# Patient Record
Sex: Male | Born: 1945 | Race: White | Hispanic: No | Marital: Married | Smoking: Never smoker
Health system: Southern US, Community
[De-identification: ages and names within clinical notes are randomized; demographics above are authoritative.]

## PROBLEM LIST (undated history)

## (undated) DIAGNOSIS — I429 Cardiomyopathy, unspecified: Secondary | ICD-10-CM

## (undated) DIAGNOSIS — I10 Essential (primary) hypertension: Secondary | ICD-10-CM

## (undated) DIAGNOSIS — I7300 Raynaud's syndrome without gangrene: Secondary | ICD-10-CM

## (undated) DIAGNOSIS — E785 Hyperlipidemia, unspecified: Secondary | ICD-10-CM

## (undated) DIAGNOSIS — E559 Vitamin D deficiency, unspecified: Secondary | ICD-10-CM

## (undated) DIAGNOSIS — I351 Nonrheumatic aortic (valve) insufficiency: Secondary | ICD-10-CM

## (undated) DIAGNOSIS — M3320 Polymyositis, organ involvement unspecified: Secondary | ICD-10-CM

## (undated) HISTORY — PX: COLONOSCOPY: SHX174

## (undated) HISTORY — PX: MUSCLE BIOPSY: SHX716

---

## 2004-09-18 ENCOUNTER — Ambulatory Visit: Payer: Self-pay | Admitting: Unknown Physician Specialty

## 2004-12-28 ENCOUNTER — Ambulatory Visit: Payer: Self-pay | Admitting: Otolaryngology

## 2005-03-19 ENCOUNTER — Ambulatory Visit: Payer: Self-pay | Admitting: Otolaryngology

## 2011-04-11 ENCOUNTER — Ambulatory Visit: Payer: Self-pay | Admitting: Unknown Physician Specialty

## 2011-11-19 ENCOUNTER — Ambulatory Visit: Payer: Self-pay | Admitting: Family Medicine

## 2011-11-20 ENCOUNTER — Ambulatory Visit: Payer: Self-pay | Admitting: Family Medicine

## 2011-11-20 LAB — CREATININE, SERUM: EGFR (Non-African Amer.): >60

## 2011-12-31 DIAGNOSIS — R5383 Other fatigue: Secondary | ICD-10-CM | POA: Insufficient documentation

## 2011-12-31 DIAGNOSIS — E785 Hyperlipidemia, unspecified: Secondary | ICD-10-CM | POA: Insufficient documentation

## 2011-12-31 DIAGNOSIS — M79609 Pain in unspecified limb: Secondary | ICD-10-CM | POA: Insufficient documentation

## 2011-12-31 DIAGNOSIS — Z0000 Encounter for general adult medical examination without abnormal findings: Secondary | ICD-10-CM | POA: Insufficient documentation

## 2012-09-28 DIAGNOSIS — E559 Vitamin D deficiency, unspecified: Secondary | ICD-10-CM | POA: Insufficient documentation

## 2012-09-28 DIAGNOSIS — N529 Male erectile dysfunction, unspecified: Secondary | ICD-10-CM | POA: Insufficient documentation

## 2012-09-28 DIAGNOSIS — J309 Allergic rhinitis, unspecified: Secondary | ICD-10-CM | POA: Insufficient documentation

## 2012-10-24 DIAGNOSIS — K219 Gastro-esophageal reflux disease without esophagitis: Secondary | ICD-10-CM | POA: Insufficient documentation

## 2013-08-10 DIAGNOSIS — M1990 Unspecified osteoarthritis, unspecified site: Secondary | ICD-10-CM | POA: Insufficient documentation

## 2015-06-23 DIAGNOSIS — I272 Pulmonary hypertension, unspecified: Secondary | ICD-10-CM | POA: Insufficient documentation

## 2015-12-11 ENCOUNTER — Other Ambulatory Visit: Payer: Self-pay | Admitting: Otolaryngology

## 2015-12-11 DIAGNOSIS — H905 Unspecified sensorineural hearing loss: Secondary | ICD-10-CM

## 2016-01-02 ENCOUNTER — Ambulatory Visit: Payer: Self-pay

## 2016-05-17 ENCOUNTER — Other Ambulatory Visit: Payer: Self-pay | Admitting: Nurse Practitioner

## 2016-05-17 DIAGNOSIS — R1084 Generalized abdominal pain: Secondary | ICD-10-CM

## 2016-05-21 ENCOUNTER — Ambulatory Visit
Admission: RE | Admit: 2016-05-21 | Discharge: 2016-05-21 | Disposition: A | Discharge status: Home / Self Care | Payer: Medicare Other | Source: Ambulatory Visit | Attending: Nurse Practitioner | Admitting: Nurse Practitioner

## 2016-05-21 DIAGNOSIS — K5730 Diverticulosis of large intestine without perforation or abscess without bleeding: Secondary | ICD-10-CM | POA: Diagnosis not present

## 2016-05-21 DIAGNOSIS — N400 Enlarged prostate without lower urinary tract symptoms: Secondary | ICD-10-CM | POA: Diagnosis not present

## 2016-05-21 DIAGNOSIS — I861 Scrotal varices: Secondary | ICD-10-CM | POA: Diagnosis not present

## 2016-05-21 DIAGNOSIS — R1084 Generalized abdominal pain: Secondary | ICD-10-CM | POA: Insufficient documentation

## 2016-05-21 DIAGNOSIS — I700 Atherosclerosis of aorta: Secondary | ICD-10-CM | POA: Diagnosis not present

## 2016-05-21 DIAGNOSIS — N281 Cyst of kidney, acquired: Secondary | ICD-10-CM | POA: Insufficient documentation

## 2016-05-21 MED ORDER — IOPAMIDOL (ISOVUE-300) INJECTION 61%
100.0000 mL | Freq: Once | INTRAVENOUS | Status: AC | PRN
  Administered 2016-05-21: 100 mL via INTRAVENOUS

## 2016-07-18 ENCOUNTER — Encounter: Payer: Self-pay | Admitting: *Deleted

## 2016-07-19 ENCOUNTER — Ambulatory Visit: Payer: Medicare Other | Admitting: Anesthesiology

## 2016-07-19 ENCOUNTER — Encounter: Payer: Self-pay | Admitting: *Deleted

## 2016-07-19 ENCOUNTER — Ambulatory Visit
Admission: RE | Admit: 2016-07-19 | Discharge: 2016-07-19 | Disposition: A | Discharge status: Home / Self Care | Payer: Medicare Other | Source: Ambulatory Visit | Attending: Unknown Physician Specialty | Admitting: Unknown Physician Specialty

## 2016-07-19 ENCOUNTER — Encounter
Admission: RE | Disposition: A | Discharge status: Home / Self Care | Payer: Self-pay | Source: Ambulatory Visit | Attending: Unknown Physician Specialty

## 2016-07-19 DIAGNOSIS — R1013 Epigastric pain: Secondary | ICD-10-CM | POA: Diagnosis not present

## 2016-07-19 DIAGNOSIS — Z8371 Family history of colonic polyps: Secondary | ICD-10-CM | POA: Diagnosis not present

## 2016-07-19 DIAGNOSIS — I429 Cardiomyopathy, unspecified: Secondary | ICD-10-CM | POA: Diagnosis not present

## 2016-07-19 DIAGNOSIS — K648 Other hemorrhoids: Secondary | ICD-10-CM | POA: Diagnosis not present

## 2016-07-19 DIAGNOSIS — K2980 Duodenitis without bleeding: Secondary | ICD-10-CM | POA: Diagnosis not present

## 2016-07-19 DIAGNOSIS — K2950 Unspecified chronic gastritis without bleeding: Secondary | ICD-10-CM | POA: Insufficient documentation

## 2016-07-19 DIAGNOSIS — K269 Duodenal ulcer, unspecified as acute or chronic, without hemorrhage or perforation: Secondary | ICD-10-CM | POA: Diagnosis not present

## 2016-07-19 DIAGNOSIS — K644 Residual hemorrhoidal skin tags: Secondary | ICD-10-CM | POA: Diagnosis not present

## 2016-07-19 DIAGNOSIS — Z7982 Long term (current) use of aspirin: Secondary | ICD-10-CM | POA: Diagnosis not present

## 2016-07-19 DIAGNOSIS — E785 Hyperlipidemia, unspecified: Secondary | ICD-10-CM | POA: Diagnosis not present

## 2016-07-19 DIAGNOSIS — K449 Diaphragmatic hernia without obstruction or gangrene: Secondary | ICD-10-CM | POA: Diagnosis not present

## 2016-07-19 DIAGNOSIS — Z1211 Encounter for screening for malignant neoplasm of colon: Secondary | ICD-10-CM | POA: Insufficient documentation

## 2016-07-19 HISTORY — PX: COLONOSCOPY WITH PROPOFOL: SHX5780

## 2016-07-19 HISTORY — DX: Hyperlipidemia, unspecified: E78.5

## 2016-07-19 HISTORY — PX: ESOPHAGOGASTRODUODENOSCOPY (EGD) WITH PROPOFOL: SHX5813

## 2016-07-19 HISTORY — DX: Nonrheumatic aortic (valve) insufficiency: I35.1

## 2016-07-19 HISTORY — DX: Polymyositis, organ involvement unspecified: M33.20

## 2016-07-19 HISTORY — DX: Raynaud's syndrome without gangrene: I73.00

## 2016-07-19 HISTORY — DX: Cardiomyopathy, unspecified: I42.9

## 2016-07-19 HISTORY — DX: Essential (primary) hypertension: I10

## 2016-07-19 HISTORY — DX: Vitamin D deficiency, unspecified: E55.9

## 2016-07-19 SURGERY — COLONOSCOPY WITH PROPOFOL
Anesthesia: General

## 2016-07-19 MED ORDER — PROPOFOL 500 MG/50ML IV EMUL
INTRAVENOUS | Status: DC | PRN
  Administered 2016-07-19: 125 ug/kg/min via INTRAVENOUS

## 2016-07-19 MED ORDER — DEXMEDETOMIDINE HCL 200 MCG/2ML IV SOLN
INTRAVENOUS | Status: DC | PRN
  Administered 2016-07-19: 8 ug via INTRAVENOUS

## 2016-07-19 MED ORDER — SODIUM CHLORIDE 0.9 % IV SOLN: INTRAVENOUS | Status: DC

## 2016-07-19 MED ORDER — SODIUM CHLORIDE 0.9 % IV SOLN
INTRAVENOUS | Status: DC
  Administered 2016-07-19: 11:00:00 via INTRAVENOUS

## 2016-07-19 MED ORDER — LACTATED RINGERS IV SOLN
INTRAVENOUS | Status: DC | PRN
  Administered 2016-07-19: 11:00:00 via INTRAVENOUS

## 2016-07-19 MED ORDER — GLYCOPYRROLATE 0.2 MG/ML IJ SOLN
INTRAMUSCULAR | Status: DC | PRN
  Administered 2016-07-19: 0.2 mg via INTRAVENOUS

## 2016-07-19 MED ORDER — PROPOFOL 10 MG/ML IV BOLUS
INTRAVENOUS | Status: DC | PRN
  Administered 2016-07-19: 20 mg via INTRAVENOUS
  Administered 2016-07-19 (×2): 50 mg via INTRAVENOUS

## 2016-07-19 NOTE — H&P (Signed)
   Primary Care Physician:  Sula RumpleVirk, Charanjit, MD Primary Gastroenterologist:  Dr. Mechele CollinElliott  Pre-Procedure History & Physical: HPI:  Kent Cunningham Donovan is a 70 y.o. male is here for an endoscopy and colonoscopy.   Past Medical History:  Diagnosis Date  . Aortic insufficiency   . Cardiomyopathy (HCC)   . Dyslipidemia   . Hypertension   . Polymyositis (HCC)   . Raynaud's disease   . Vitamin D deficiency     Past Surgical History:  Procedure Laterality Date  . COLONOSCOPY    . MUSCLE BIOPSY      Prior to Admission medications   Medication Sig Start Date End Date Taking? Authorizing Provider  aspirin 81 MG chewable tablet Chew by mouth daily.   Yes Historical Provider, MD  b complex vitamins tablet Take 1 tablet by mouth daily.   Yes Historical Provider, MD  Bioflavonoid Products (BIOFLEX PO) Take by mouth.   Yes Historical Provider, MD  Thressa Shellerinnamon Bark POWD by Does not apply route.   Yes Historical Provider, MD  CRANBERRY EXTRACT PO Take by mouth.   Yes Historical Provider, MD  NATURAL PSYLLIUM SEED PO Take by mouth.   Yes Historical Provider, MD  niacin 500 MG tablet Take 500 mg by mouth at bedtime.   Yes Historical Provider, MD  OMEGA-3 FATTY ACIDS PO Take by mouth.   Yes Historical Provider, MD  TURMERIC PO Take by mouth.   Yes Historical Provider, MD  vitamin C (ASCORBIC ACID) 500 MG tablet Take 500 mg by mouth daily.   Yes Historical Provider, MD    Allergies as of 07/09/2016  . (Not on File)    History reviewed. No pertinent family history.  Social History   Social History  . Marital status: Married    Spouse name: N/A  . Number of children: N/A  . Years of education: N/A   Occupational History  . Not on file.   Social History Main Topics  . Smoking status: Never Smoker  . Smokeless tobacco: Never Used  . Alcohol use Yes  . Drug use: No  . Sexual activity: Not on file   Other Topics Concern  . Not on file   Social History Narrative  . No narrative on file     Review of Systems: See HPI, otherwise negative ROS  Physical Exam: BP (!) 149/97   Pulse 76   Temp 97.1 F (36.2 C) (Tympanic)   Resp (!) 22   Ht 6\' 1"  (1.854 m)   Wt 79.4 kg (175 lb)   SpO2 100%   BMI 23.09 kg/m  General:   Alert,  pleasant and cooperative in NAD Head:  Normocephalic and atraumatic. Neck:  Supple; no masses or thyromegaly. Lungs:  Clear throughout to auscultation.    Heart:  Regular rate and rhythm. Abdomen:  Soft, nontender and nondistended. Normal bowel sounds, without guarding, and without rebound.   Neurologic:  Alert and  oriented x4;  grossly normal neurologically.  Impression/Plan: Kent Cunningham Forster is here for an endoscopy and colon[facility],  And epigastric abdominal pain and general abdominal pain.  Risks, benefits, limitations, and alternatives regarding  endoscopy and colonoscopy have been reviewed with the patient.  Questions have been answered.  All parties agreeable.   Lynnae PrudeELLIOTT, Idriss Quackenbush, MD  07/19/2016, 10:45 AM

## 2016-07-19 NOTE — Anesthesia Preprocedure Evaluation (Signed)
Anesthesia Evaluation  Patient identified by MRN, date of birth, ID band Patient awake    Reviewed: Allergy & Precautions, H&P , NPO status , Patient's Chart, lab work & pertinent test results  History of Anesthesia Complications Negative for: history of anesthetic complications  Airway Mallampati: III  TM Distance: <3 FB Neck ROM: limited    Dental  (+) Poor Dentition, Chipped   Pulmonary neg pulmonary ROS, neg shortness of breath,    Pulmonary exam normal breath sounds clear to auscultation       Cardiovascular Exercise Tolerance: Good hypertension, (-) angina+ Peripheral Vascular Disease  (-) DOE Normal cardiovascular exam+ Valvular Problems/Murmurs  Rhythm:regular Rate:Normal     Neuro/Psych  Neuromuscular disease negative psych ROS   GI/Hepatic negative GI ROS, Neg liver ROS,   Endo/Other  negative endocrine ROS  Renal/GU negative Renal ROS  negative genitourinary   Musculoskeletal   Abdominal   Peds  Hematology negative hematology ROS (+)   Anesthesia Other Findings Past Medical History: No date: Aortic insufficiency No date: Cardiomyopathy (HCC) No date: Dyslipidemia No date: Hypertension No date: Polymyositis (HCC) No date: Raynaud's disease No date: Vitamin D deficiency  Past Surgical History: No date: COLONOSCOPY No date: MUSCLE BIOPSY     Reproductive/Obstetrics negative OB ROS                             Anesthesia Physical Anesthesia Plan  ASA: III  Anesthesia Plan: General   Post-op Pain Management:    Induction:   Airway Management Planned:   Additional Equipment:   Intra-op Plan:   Post-operative Plan:   Informed Consent: I have reviewed the patients History and Physical, chart, labs and discussed the procedure including the risks, benefits and alternatives for the proposed anesthesia with the patient or authorized representative who has indicated  his/her understanding and acceptance.   Dental Advisory Given  Plan Discussed with: Anesthesiologist, CRNA and Surgeon  Anesthesia Plan Comments:         Anesthesia Quick Evaluation

## 2016-07-19 NOTE — Op Note (Signed)
[facility] Patient Name: Kent GongMichael Lamar Procedure Date: 07/19/2016 10:35 AM MRN: [id number] Account #: [id number] Date of Birth: 1946/09/26 Admit Type: Outpatient Age: [age] [facility] Gender: Male Note Status: Finalized Procedure:            Upper GI endoscopy Indications:          Epigastric abdominal pain Providers:            Scot Junobert T. Eshawn Coor, MD Referring MD:         Haynes Kernsharanjit S. Virk (Referring MD) Medicines:            Propofol per Anesthesia Complications:        No immediate complications. Procedure:            Pre-Anesthesia Assessment:                       - After reviewing the risks and benefits, the patient                        was deemed in satisfactory condition to undergo the                        procedure.                       After obtaining informed consent, the endoscope was                        passed under direct vision. Throughout the procedure,                        the patient's blood pressure, pulse, and oxygen                        saturations were monitored continuously. The Endoscope                        was introduced through the mouth, and advanced to the                        second part of duodenum. The upper GI endoscopy was                        accomplished without difficulty. The patient tolerated                        the procedure well. Findings:      The examined esophagus was normal. GEJ 44cm.      A medium-sized hiatal hernia was present.      Diffuse and patchy moderate inflammation characterized by erythema,       granularity and shallow ulcerations was found in the gastric antrum.       Biopsies were taken with a cold forceps for histology. Biopsies were       taken with a cold forceps for Helicobacter pylori testing.      One non-bleeding superficial duodenal ulcer with no stigmata of bleeding       was found in the second portion of the duodenum. The lesion was 4 mm in    largest dimension.      Diffuse moderate inflammation characterized by erythema and granularity  was found in the duodenal bulb. Impression:           - Normal esophagus.                       - Medium-sized hiatal hernia.                       - Gastritis. Biopsied.                       - One non-bleeding duodenal ulcer with no stigmata of                        bleeding.                       - Duodenitis. Recommendation:       - Await pathology results. Carafate and acid reduction                        medicine. Scot Jun, MD 07/19/2016 11:05:02 AM This report has been signed electronically. Number of Addenda: 0 Note Initiated On: 07/19/2016 10:35 AM      [facility]

## 2016-07-19 NOTE — Transfer of Care (Signed)
Immediate Anesthesia Transfer of Care Note  Patient: Kent BrownMichael E Cunningham  Procedure(s) Performed: Procedure(s): COLONOSCOPY WITH PROPOFOL (N/A) ESOPHAGOGASTRODUODENOSCOPY (EGD) WITH PROPOFOL (N/A)  Patient Location: PACU and Endoscopy Unit  Anesthesia Type:General  Level of Consciousness: awake, alert  and oriented  Airway & Oxygen Therapy: Patient Spontanous Breathing and Patient connected to nasal cannula oxygen  Post-op Assessment: Report given to RN and Post -op Vital signs reviewed and stable  Post vital signs: Reviewed and stable  Last Vitals:  Vitals:   07/19/16 1037 07/19/16 1123  BP: (!) 149/97 131/76  Pulse: 76 77  Resp: (!) 22 (!) 22  Temp: 36.2 C (!) 35.8 C    Last Pain:  Vitals:   07/19/16 1123  TempSrc: Tympanic         Complications: No apparent anesthesia complications

## 2016-07-19 NOTE — Op Note (Signed)
[facility] Gastroenterology Patient Name: Kent Cunningham Procedure Date: 07/19/2016 10:34 AM MRN: [id number] Account #: [id number] Date of Birth: 1946/09/20 Admit Type: Outpatient Age: 70 Room: [facility] ENDO ROOM 1 Gender: Male Note Status: Finalized Procedure:            Colonoscopy Indications:          Colon cancer screening in patient at increased risk:                        Family history of 1st-degree relative with colon polyps Providers:            Scot Jun, MD Referring MD:         Haynes Kerns (Referring MD) Medicines:            Propofol per Anesthesia Complications:        No immediate complications. Procedure:            Pre-Anesthesia Assessment:                       - After reviewing the risks and benefits, the patient                        was deemed in satisfactory condition to undergo the                        procedure.                       After obtaining informed consent, the colonoscope was                        passed under direct vision. Throughout the procedure,                        the patient's blood pressure, pulse, and oxygen                        saturations were monitored continuously. The                        Colonoscope was introduced through the anus and                        advanced to the the cecum, identified by appendiceal                        orifice and ileocecal valve. The colonoscopy was                        performed without difficulty. The patient tolerated the                        procedure well. The quality of the bowel preparation                        was excellent. Findings:      External and internal hemorrhoids were found during endoscopy. The       hemorrhoids were medium-sized and Grade II (internal hemorrhoids that       prolapse but reduce spontaneously).      The exam was otherwise  without abnormality. Impression:           - External and internal hemorrhoids.      - The examination was otherwise normal.                       - No specimens collected. Recommendation:       - Repeat colonoscopy in 5 years for surveillance due to                        family history. Scot Junobert T Elliott, MD 07/19/2016 11:23:43 AM This report has been signed electronically. Number of Addenda: 0 Note Initiated On: 07/19/2016 10:34 AM Scope Withdrawal Time: 0 hours 11 minutes 6 seconds  Total Procedure Duration: 0 hours 14 minutes 51 seconds       [facility]

## 2016-07-20 ENCOUNTER — Encounter: Payer: Self-pay | Admitting: Unknown Physician Specialty

## 2016-07-20 NOTE — Anesthesia Postprocedure Evaluation (Signed)
Anesthesia Post Note  Patient: America BrownMichael E Riggan  Procedure(s) Performed: Procedure(s) (LRB): COLONOSCOPY WITH PROPOFOL (N/A) ESOPHAGOGASTRODUODENOSCOPY (EGD) WITH PROPOFOL (N/A)  Patient location during evaluation: Endoscopy Anesthesia Type: General Level of consciousness: awake and alert Pain management: pain level controlled Vital Signs Assessment: post-procedure vital signs reviewed and stable Respiratory status: spontaneous breathing, nonlabored ventilation, respiratory function stable and patient connected to nasal cannula oxygen Cardiovascular status: blood pressure returned to baseline and stable Postop Assessment: no signs of nausea or vomiting Anesthetic complications: no    Last Vitals:  Vitals:   07/19/16 1143 07/19/16 1153  BP: 130/85 (!) 148/96  Pulse: 89 81  Resp: 14 12  Temp:      Last Pain:  Vitals:   07/19/16 1123  TempSrc: Tympanic                 Cleda MccreedyJoseph K Piscitello

## 2016-07-22 LAB — SURGICAL PATHOLOGY

## 2018-03-03 ENCOUNTER — Other Ambulatory Visit: Payer: Self-pay | Admitting: Family Medicine

## 2018-03-03 DIAGNOSIS — N50812 Left testicular pain: Secondary | ICD-10-CM

## 2018-03-10 ENCOUNTER — Ambulatory Visit
Admission: RE | Admit: 2018-03-10 | Discharge: 2018-03-10 | Disposition: A | Discharge status: Home / Self Care | Payer: Medicare Other | Source: Ambulatory Visit | Attending: Family Medicine | Admitting: Family Medicine

## 2018-03-10 DIAGNOSIS — N50812 Left testicular pain: Secondary | ICD-10-CM

## 2018-03-10 DIAGNOSIS — I861 Scrotal varices: Secondary | ICD-10-CM | POA: Diagnosis not present

## 2018-03-10 DIAGNOSIS — N433 Hydrocele, unspecified: Secondary | ICD-10-CM | POA: Insufficient documentation

## 2018-03-17 IMAGING — CT CT ABD-PELV W/ CM
2 of 5 series · 16 of 46 positions shown, 18 images · IV contrast (iopamidol)
Comparison: None.

CLINICAL DATA: Generalized abdominal pain, chronic with recent
worsening.

EXAM:
CT ABDOMEN AND PELVIS WITH CONTRAST
TECHNIQUE: Multidetector CT imaging of the abdomen and pelvis was performed
using the standard protocol following bolus administration of
intravenous contrast.
CONTRAST:  100mL NJG0UM-GSS IOPAMIDOL (NJG0UM-GSS) INJECTION 61%

[Series 2: axial soft tissue · axial · 0.76mm/px · z∈[-958,-508]mm · 13 of 104 slices shown, 15 images]
[im 7/104  soft-tissue]
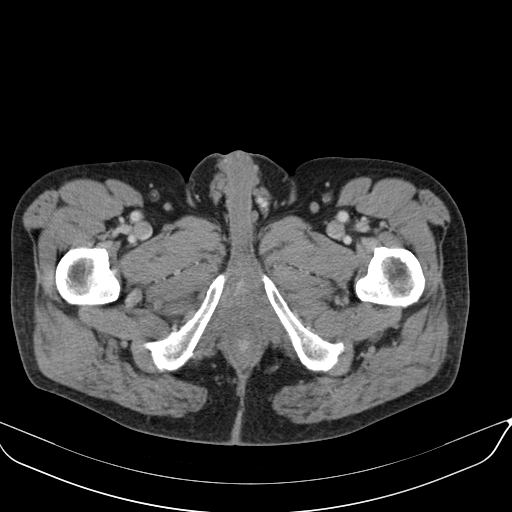
[im 7/104  bone]
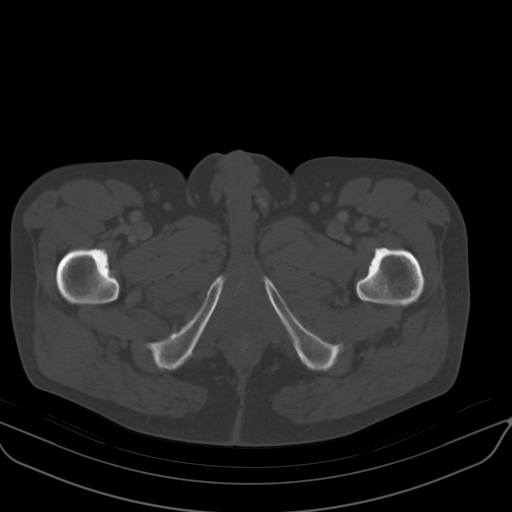
[im 13/104  soft-tissue]
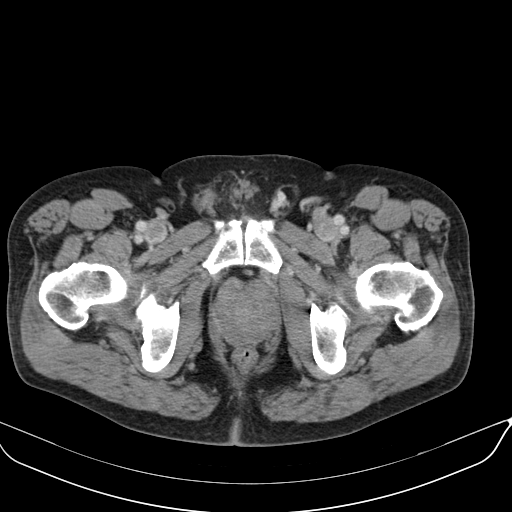
[im 25/104  soft-tissue]
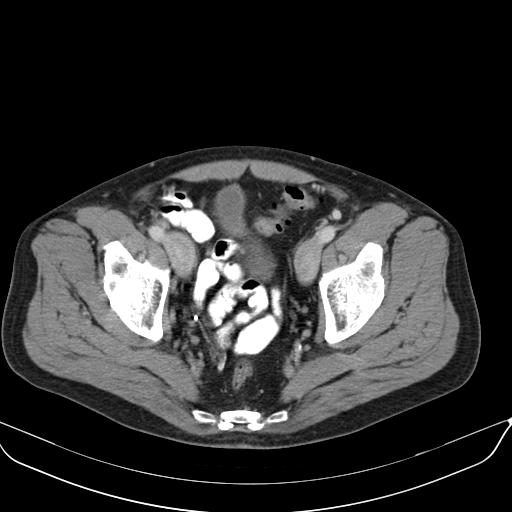
[im 31/104  soft-tissue]
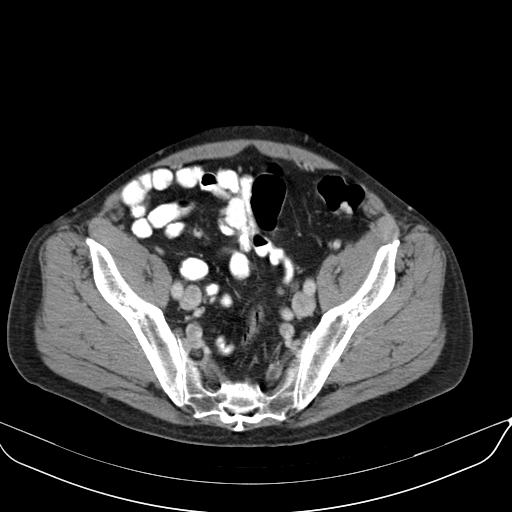
[im 37/104  soft-tissue]
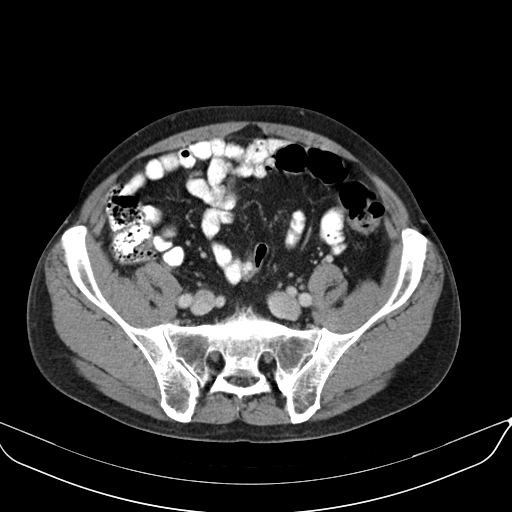
[im 43/104  soft-tissue]
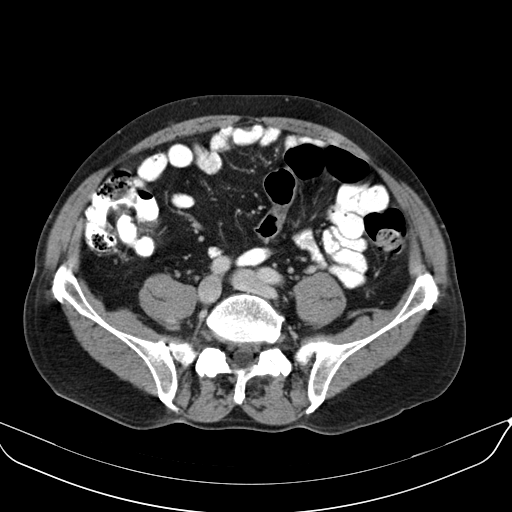
[im 55/104  soft-tissue]
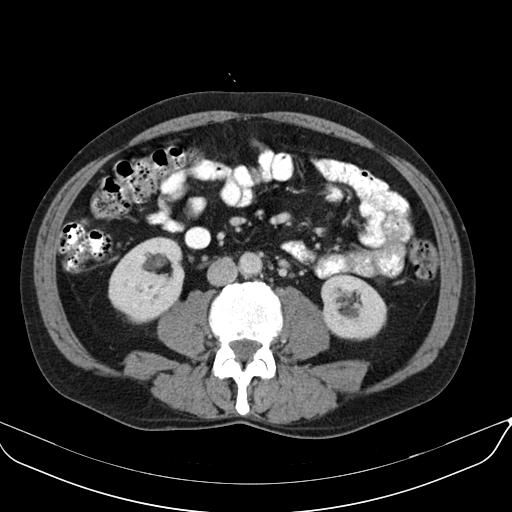
[im 61/104  soft-tissue]
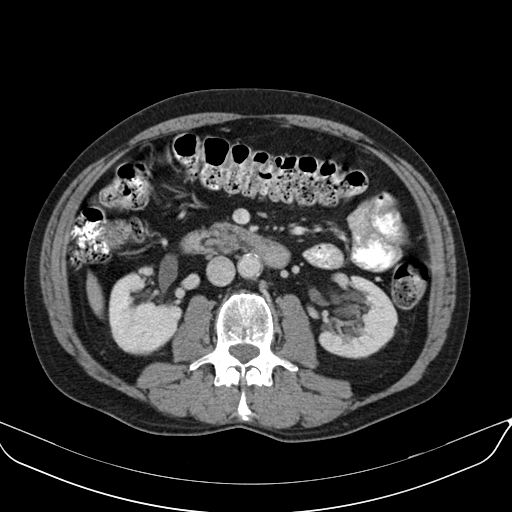
[im 67/104  soft-tissue]
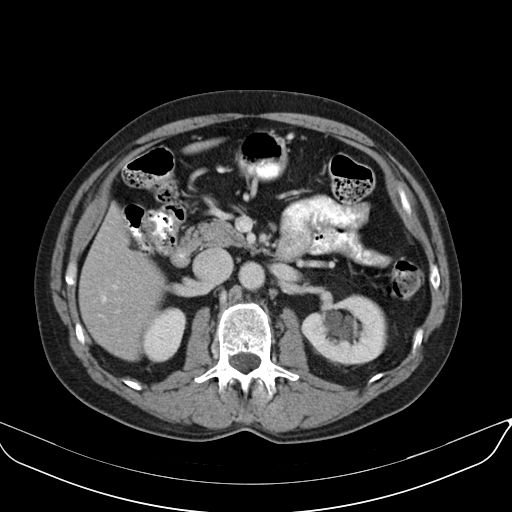
[im 67/104  bone]
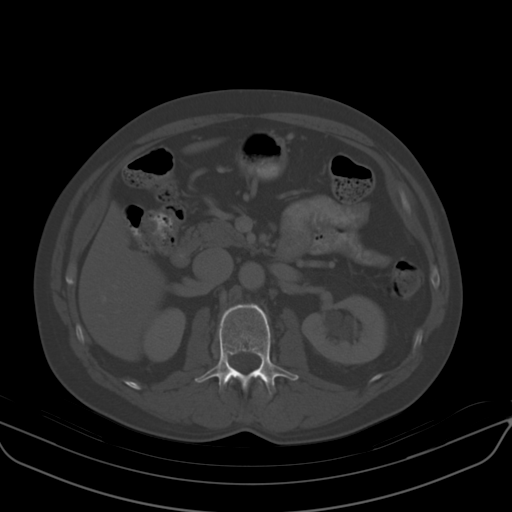
[im 73/104  soft-tissue]
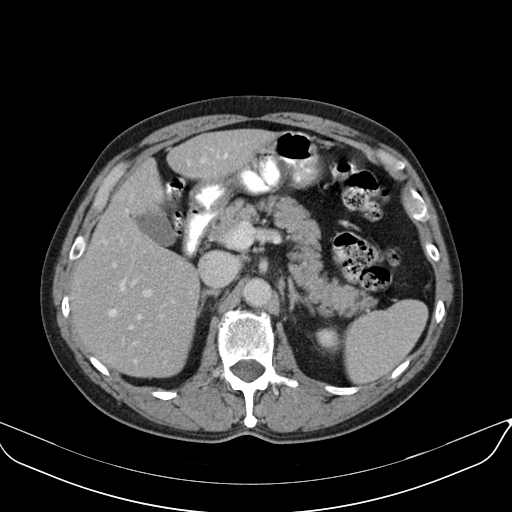
[im 79/104  soft-tissue]
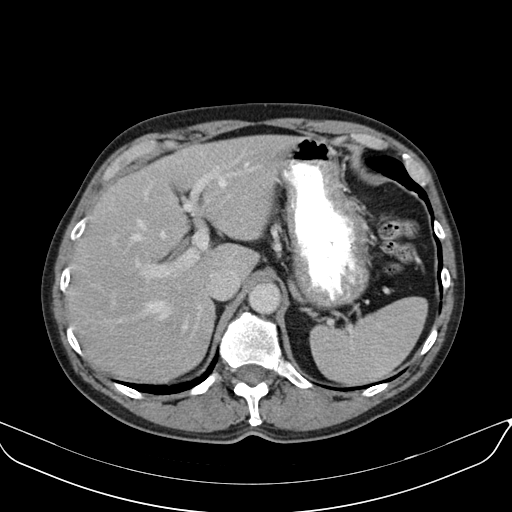
[im 91/104  soft-tissue]
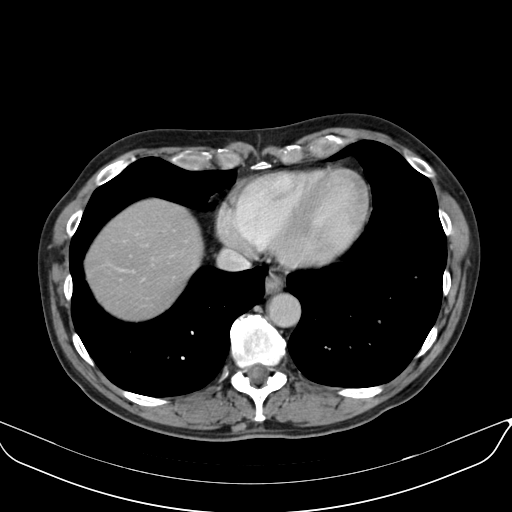
[im 97/104  soft-tissue]
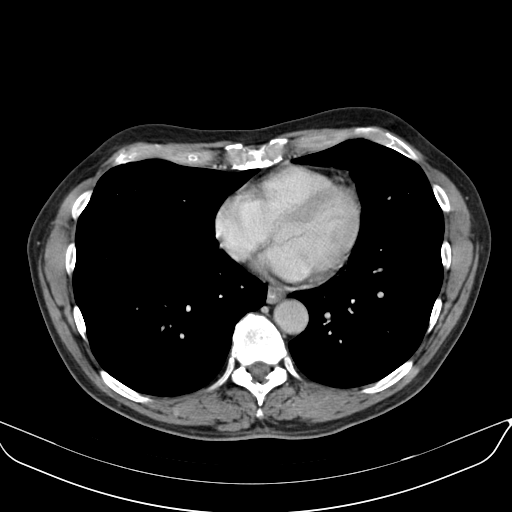

[Series 602: coronal · coronal · 1.00mm/px · 3 of 122 slices shown]
[im 41/122  soft-tissue]
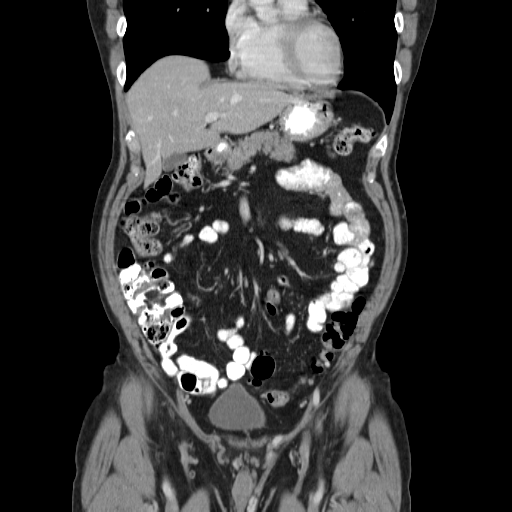
[im 54/122  soft-tissue]
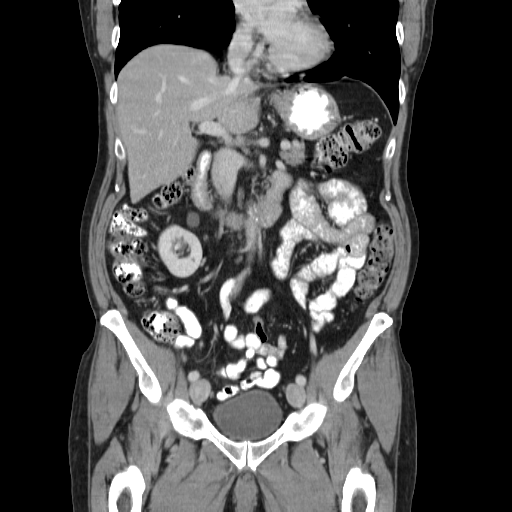
[im 68/122  soft-tissue]
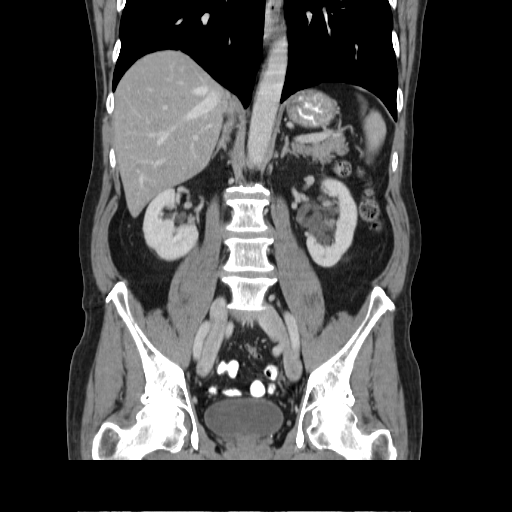

[16 of 46 positions shown; findings below may reference images not displayed]

FINDINGS: Lower chest: No significant pulmonary nodules or acute consolidative
airspace disease.

Hepatobiliary: Normal liver with no liver mass. Normal gallbladder
with no radiopaque cholelithiasis. No biliary ductal dilatation.

Pancreas: Normal, with no mass or duct dilation.

Spleen: Normal size. No mass.

Adrenals/Urinary Tract: Normal adrenals. No hydronephrosis. Simple
parapelvic renal cysts in the left greater than right renal sinuses.
Hypodense 0.3 cm renal cortical lesion in the posterior upper left
kidney, too small to characterize, for which no further follow-up is
required. This recommendation follows ACR consensus guidelines:
Management of the Incidental Renal Mass on CT: A White Paper of the
ACR Incidental Findings Committee. [HOSPITAL] 0386; article in
press. No suspicious renal cortical masses. Normal bladder.

Stomach/Bowel: Grossly normal stomach. Normal caliber small bowel
with no small bowel wall thickening. Normal appendix. Mild sigmoid
diverticulosis, with no large bowel wall thickening or pericolonic
fat stranding. Oral contrast progresses to the distal colon.

Vascular/Lymphatic: Atherosclerotic nonaneurysmal abdominal aorta.
Patent portal, splenic, hepatic and renal veins. No pathologically
enlarged lymph nodes in the abdomen or pelvis.

Reproductive: Mildly to moderately enlarged prostate. Prostate
dimensions 4.8 x 4.6 x 4.9 cm (volume = 57 cm^3).

Other: No pneumoperitoneum, ascites or focal fluid collection. Left
sided varicocele.

Musculoskeletal: No aggressive appearing focal osseous lesions. Mild
thoracolumbar spondylosis.
IMPRESSION: 1. No acute abnormality. No evidence of bowel obstruction or acute
bowel inflammation. Normal appendix. Mild sigmoid diverticulosis,
with no evidence of acute diverticulitis.
2. Mild-to-moderate prostatomegaly.
3. Left-sided varicocele.
4. Aortic atherosclerosis.
5. Parapelvic renal cysts in both kidneys.  No hydronephrosis.

## 2018-05-08 ENCOUNTER — Ambulatory Visit: Payer: Self-pay | Admitting: Urology

## 2018-05-21 ENCOUNTER — Ambulatory Visit: Payer: Self-pay | Admitting: Urology

## 2018-06-23 ENCOUNTER — Ambulatory Visit (INDEPENDENT_AMBULATORY_CARE_PROVIDER_SITE_OTHER): Payer: Medicare Other | Admitting: Urology

## 2018-06-23 ENCOUNTER — Encounter: Payer: Self-pay | Admitting: Urology

## 2018-06-23 VITALS — BP 155/99 | HR 69 | Ht 73.0 in | Wt 170.0 lb

## 2018-06-23 DIAGNOSIS — I861 Scrotal varices: Secondary | ICD-10-CM | POA: Diagnosis not present

## 2018-06-23 DIAGNOSIS — I10 Essential (primary) hypertension: Secondary | ICD-10-CM | POA: Insufficient documentation

## 2018-06-23 DIAGNOSIS — I351 Nonrheumatic aortic (valve) insufficiency: Secondary | ICD-10-CM | POA: Insufficient documentation

## 2018-06-23 DIAGNOSIS — I429 Cardiomyopathy, unspecified: Secondary | ICD-10-CM | POA: Insufficient documentation

## 2018-06-23 DIAGNOSIS — I7300 Raynaud's syndrome without gangrene: Secondary | ICD-10-CM | POA: Insufficient documentation

## 2018-06-23 DIAGNOSIS — N50812 Left testicular pain: Secondary | ICD-10-CM

## 2018-06-23 DIAGNOSIS — M3320 Polymyositis, organ involvement unspecified: Secondary | ICD-10-CM | POA: Insufficient documentation

## 2018-06-23 NOTE — Progress Notes (Signed)
06/23/2018 11:00 AM   Kent Cunningham Oct 27, 1945 [id number]  Referring provider: Marina Goodell, MD [address] [place], [place] [address]  Chief Complaint  Patient presents with  . Testicle Pain    New patient    HPI: 72 year old male who presents today for further evaluation of left testicular pain.  He reports that over the past year, he left testicular pain rating up to his groin exacerbated by physical activity and lifting.  The pain is described as dull and aching but sometimes more sharp.  It is relieved with rest, sometimes it lasts 5 minutes and other times lasts for a few hours.  He does have fullness in his scrotum which is been present for at least 20+ years.  This is unchanged.  Previous CT scans have remarked on the left hydrocele which is also appreciated on ultrasound.  He denies any associated abdominal pain.  No urinary symptoms.  Scrotal ultrasound was ordered by primary care physician and performed in June.  This showed a small right epididymal cyst.  Bilateral left greater than right varicoceles, and a small right hydrocele.  Patient reports that he was concerned about the findings because he did not understand what they meant.   PMH: Past Medical History:  Diagnosis Date  . Aortic insufficiency   . Cardiomyopathy (HCC)   . Dyslipidemia   . Hypertension   . Polymyositis (HCC)   . Raynaud's disease   . Vitamin D deficiency     Surgical History: Past Surgical History:  Procedure Laterality Date  . COLONOSCOPY    . COLONOSCOPY WITH PROPOFOL N/A 07/19/2016   Procedure: COLONOSCOPY WITH PROPOFOL;  Surgeon: Scot Jun, MD;  Location[facility] ENDOSCOPY;  Service: Endoscopy;  Laterality: N/A;  . ESOPHAGOGASTRODUODENOSCOPY (EGD) WITH PROPOFOL N/A 07/19/2016   Procedure: ESOPHAGOGASTRODUODENOSCOPY (EGD) WITH PROPOFOL;  Surgeon: Scot Jun, MD;  Location: [facility] ENDOSCOPY;  Service: Endoscopy;  Laterality: N/A;  . MUSCLE BIOPSY      Home  Medications:  Allergies as of 06/23/2018   No Known Allergies     Medication List        Accurate as of 06/23/18 11:00 AM. Always use your most recent med list.          aspirin 81 MG chewable tablet Chew by mouth daily.   b complex vitamins tablet Take 1 tablet by mouth daily.   BIOFLEX PO Take by mouth.   Cinnamon Bark Powd by Does not apply route.   CRANBERRY EXTRACT PO Take by mouth.   NATURAL PSYLLIUM SEED PO Take by mouth.   niacin 500 MG tablet Take 500 mg by mouth at bedtime.   OMEGA-3 FATTY ACIDS PO Take by mouth.   TURMERIC PO Take by mouth.   vitamin C 500 MG tablet Commonly known as:  ASCORBIC ACID Take 500 mg by mouth daily.       Allergies: No Known Allergies  Family History: No family history on file.  Social History:  reports that he has never smoked. He has never used smokeless tobacco. He reports that he drinks alcohol. He reports that he does not use drugs.  ROS: UROLOGY Frequent Urination?: No Hard to postpone urination?: No Burning/pain with urination?: No Get up at night to urinate?: Yes Leakage of urine?: No Urine stream starts and stops?: No Trouble starting stream?: No Do you have to strain to urinate?: No Blood in urine?: No Urinary tract infection?: No Sexually transmitted disease?: No Injury to kidneys or bladder?:  No Painful intercourse?: No Weak stream?: Yes Erection problems?: Yes Penile pain?: No  Gastrointestinal Nausea?: No Vomiting?: No Indigestion/heartburn?: Yes Diarrhea?: No Constipation?: No  Constitutional Fever: No Night sweats?: No Weight loss?: No Fatigue?: No  Skin Skin rash/lesions?: No Itching?: No  Eyes Blurred vision?: No Double vision?: No  Ears/Nose/Throat Sore throat?: No Sinus problems?: No  Hematologic/Lymphatic Swollen glands?: No Easy bruising?: No  Cardiovascular Leg swelling?: No Chest pain?: No  Respiratory Cough?: No Shortness of breath?:  No  Endocrine Excessive thirst?: No  Musculoskeletal Back pain?: No Joint pain?: No  Neurological Headaches?: No Dizziness?: No  Psychologic Depression?: No Anxiety?: No  Physical Exam: BP (!) 155/99   Pulse 69   Ht 6\' 1"  (1.854 m)   Wt 170 lb (77.1 kg)   BMI 22.43 kg/m   Constitutional:  Alert and oriented, No acute distress. HEENT: [place] AT, moist mucus membranes.  Trachea midline, no masses. Cardiovascular: No clubbing, cyanosis, or edema. Respiratory: Normal respiratory effort, no increased work of breathing. GI: Abdomen is soft, nontender, nondistended, no abdominal masses GU: Circumcised phallus with orthotopic meatus.  Bilateral descended testicles which are normal without masses.  There is a grade 3 left varicocele visibly from a standing position.  Mild right varicocele appreciated.  No inguinal hernias appreciated when standing with Valsalva. Skin: No rashes, bruises or suspicious lesions. Neurologic: Grossly intact, no focal deficits, moving all 4 extremities. Psychiatric: Normal mood and affect.  Laboratory Data: Lab Results  Component Value Date   CREATININE 1.09 11/20/2011    Urinalysis N/a  Pertinent Imaging: CLINICAL DATA:  Scrotal pain on the left, chronic  EXAM: SCROTAL ULTRASOUND  DOPPLER ULTRASOUND OF THE TESTICLES  TECHNIQUE: Complete ultrasound examination of the testicles, epididymis, and other scrotal structures was performed. Color and spectral Doppler ultrasound were also utilized to evaluate blood flow to the testicles.  COMPARISON:  None.  FINDINGS: Right testicle  Measurements: 4.5 x 1.1 x 3.3 cm. No mass or microlithiasis visualized.  Left testicle  Measurements: 3.0 x 1.9 x 3.4 cm. No mass or microlithiasis visualized.  Right epididymis: There are several cysts in the right epididymis, largest in the head region measuring 1.4 x 0.8 x 0.7 cm. No epididymitis evident.  Left epididymis: Normal in size and  appearance. No evident epididymitis.  Hydrocele: There is a rather minimal hydrocele on the right. No hydrocele appreciable on the left.  Varicocele: There is a sizable varicocele on the left which extends from the scrotum into the left inguinal canal region. No appreciable hydrocele on the right evident. A smaller hydrocele is noted on the right which also extends to the inguinal canal.  Pulsed Doppler interrogation of both testes demonstrates normal low resistance arterial and venous waveforms bilaterally.  No appreciable scrotal wall thickening or scrotal abscess.  IMPRESSION: 1. Several small epididymal cysts on the right. No epididymitis on either side.  2. Varicoceles bilaterally, larger on the left than on the right, extending from the scrotum into the inguinal canal regions.  3. Small hydrocele on the right. No appreciable hydrocele on the left.  4. No intratesticular mass or orchitis on either side. No testicular torsion on either side.   Electronically Signed   By: Bretta BangWilliam  Woodruff III M.D.   On: 03/10/2018 14:11  Scrotal ultrasound personally reviewed.    Assessment & Plan:    1. Left testicular pain Intermittent left testicular pain radiating to the inguinal area with strenuous activity I suspect he may have an occult hernia or  possibly an abdominal wall strain contributing to this Recommend supportive care-compression underwear No palpable hernia today on exam NSAIDs and rest for discomfort  2. 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 I explained that his nature of pain does not seem to be associated with the more typical pain from varicocele which is chronic dull aching primarily at the end of the day Additionally, given that his varicoceles have been present for 20+ years, the relatively new onset of his pain is not consistent with the chronicity of his varicocele Finally, we discussed that if we did treat the  varicocele, there is possibility that his pain would not resolve With did discuss varicocelectomy today along with the risks and benefits Not interested in pursuing this  F/u prn  Vanna Scotland, MD  [facility] [address] [place], [place] [address] [phone]

## 2018-07-14 ENCOUNTER — Telehealth: Payer: Self-pay | Admitting: Urology

## 2018-07-14 NOTE — Telephone Encounter (Signed)
I don't see a urine that was collected. Is this suppose to have been done? Please advise

## 2018-07-14 NOTE — Telephone Encounter (Signed)
Pt called lmom asking for his urine results that was done at his last visit 9/24, states he is not sure how to look up his results, also says if he is unable to answer please leave detailed message on his vm. Please advise. Thanks.

## 2018-07-17 NOTE — Telephone Encounter (Signed)
Called patient and left detailed message that UA was not done at last visit , he was not noted to have symptoms at that time if he is having symptoms now then he should contact the office an drop off another sample.

## 2021-05-30 ENCOUNTER — Other Ambulatory Visit: Payer: Self-pay | Admitting: Internal Medicine

## 2021-05-30 DIAGNOSIS — R945 Abnormal results of liver function studies: Secondary | ICD-10-CM

## 2021-05-30 DIAGNOSIS — R7989 Other specified abnormal findings of blood chemistry: Secondary | ICD-10-CM

## 2021-06-07 ENCOUNTER — Other Ambulatory Visit: Payer: Self-pay

## 2021-06-07 ENCOUNTER — Ambulatory Visit
Admission: RE | Admit: 2021-06-07 | Discharge: 2021-06-07 | Disposition: A | Discharge status: Home / Self Care | Payer: Medicare Other | Source: Ambulatory Visit | Attending: Internal Medicine | Admitting: Internal Medicine

## 2021-06-07 DIAGNOSIS — R945 Abnormal results of liver function studies: Secondary | ICD-10-CM | POA: Insufficient documentation

## 2021-06-07 DIAGNOSIS — R7989 Other specified abnormal findings of blood chemistry: Secondary | ICD-10-CM

## 2021-08-28 ENCOUNTER — Encounter: Payer: Self-pay | Admitting: Oncology

## 2021-08-28 ENCOUNTER — Inpatient Hospital Stay: Payer: Medicare Other

## 2021-08-28 ENCOUNTER — Other Ambulatory Visit: Payer: Self-pay

## 2021-08-28 ENCOUNTER — Inpatient Hospital Stay: Payer: Medicare Other | Attending: Oncology | Admitting: Oncology

## 2021-08-28 ENCOUNTER — Encounter (INDEPENDENT_AMBULATORY_CARE_PROVIDER_SITE_OTHER): Payer: Self-pay

## 2021-08-28 VITALS — BP 130/90 | HR 86 | Temp 97.8°F | Resp 18 | Wt 167.8 lb

## 2021-08-28 DIAGNOSIS — R7989 Other specified abnormal findings of blood chemistry: Secondary | ICD-10-CM | POA: Diagnosis present

## 2021-08-28 DIAGNOSIS — Z148 Genetic carrier of other disease: Secondary | ICD-10-CM

## 2021-08-28 DIAGNOSIS — K760 Fatty (change of) liver, not elsewhere classified: Secondary | ICD-10-CM | POA: Diagnosis not present

## 2021-08-28 DIAGNOSIS — I7300 Raynaud's syndrome without gangrene: Secondary | ICD-10-CM | POA: Diagnosis not present

## 2021-08-28 LAB — COMPREHENSIVE METABOLIC PANEL
ALT: 253 U/L — ABNORMAL HIGH (ref 0–44)
AST: 149 U/L — ABNORMAL HIGH (ref 15–41)
Albumin: 4.1 g/dL (ref 3.5–5.0)
Alkaline Phosphatase: 94 U/L (ref 38–126)
Anion gap: 11 (ref 5–15)
BUN: 16 mg/dL (ref 8–23)
CO2: 26 mmol/L (ref 22–32)
Calcium: 9.1 mg/dL (ref 8.9–10.3)
Chloride: 101 mmol/L (ref 98–111)
Creatinine, Ser: 0.95 mg/dL (ref 0.61–1.24)
GFR, Estimated: >60 mL/min (ref >60)
Glucose, Bld: 104 mg/dL — ABNORMAL HIGH (ref 70–99)
Potassium: 4.4 mmol/L (ref 3.5–5.1)
Sodium: 138 mmol/L (ref 135–145)
Total Bilirubin: 1.6 mg/dL — ABNORMAL HIGH (ref 0.3–1.2)
Total Protein: 7.1 g/dL (ref 6.5–8.1)

## 2021-08-28 LAB — CBC WITH DIFFERENTIAL/PLATELET
Abs Immature Granulocytes: 0.02 10*3/uL (ref 0.00–0.07)
Basophils Absolute: 0 10*3/uL (ref 0.0–0.1)
Basophils Relative: 1 %
Eosinophils Absolute: 0.1 10*3/uL (ref 0.0–0.5)
Eosinophils Relative: 1 %
HCT: 47 % (ref 39.0–52.0)
Hemoglobin: 16.2 g/dL (ref 13.0–17.0)
Immature Granulocytes: 0 %
Lymphocytes Relative: 19 %
Lymphs Abs: 1.1 10*3/uL (ref 0.7–4.0)
MCH: 34.2 pg — ABNORMAL HIGH (ref 26.0–34.0)
MCHC: 34.5 g/dL (ref 30.0–36.0)
MCV: 99.4 fL (ref 80.0–100.0)
Monocytes Absolute: 0.7 10*3/uL (ref 0.1–1.0)
Monocytes Relative: 12 %
Neutro Abs: 3.9 10*3/uL (ref 1.7–7.7)
Neutrophils Relative %: 67 %
Platelets: 178 10*3/uL (ref 150–400)
RBC: 4.73 MIL/uL (ref 4.22–5.81)
RDW: 12.8 % (ref 11.5–15.5)
WBC: 5.9 10*3/uL (ref 4.0–10.5)
nRBC: 0 % (ref 0.0–0.2)

## 2021-08-28 LAB — FERRITIN: Ferritin: 966 ng/mL — ABNORMAL HIGH (ref 24–336)

## 2021-08-28 LAB — IRON AND TIBC
Iron: 159 ug/dL (ref 45–182)
Saturation Ratios: 56 % — ABNORMAL HIGH (ref 17.9–39.5)
TIBC: 283 ug/dL (ref 250–450)
UIBC: 124 ug/dL

## 2021-08-28 NOTE — Progress Notes (Signed)
Hematology/Oncology Consult note [phone] HZ:[phone] [phone]      Patient Care Team: Idelle Crouch, MD as PCP - General (Internal Medicine)  REFERRING PROVIDER: Geanie Kenning, PA*  CHIEF COMPLAINTS/REASON FOR VISIT:  Evaluation of elevated ferritin, hemochromatosis gene mutation  HISTORY OF PRESENTING ILLNESS:   Kent Cunningham is a  75 y.o.  male with PMH listed below was seen in consultation at the request of  Gerarda Gunther M, [place]*  for evaluation of elevated ferritin, hemochromatosis gene mutation.  Patient had blood work done at primary care provider office in September 2022, it was noticed that patient has transaminitis with AST of 400 09, ALT 763, normal alkaline phosphatase.  Total bilirubin was 2. Patient use to drink 1 glass of wine daily.  He stopped after abnormal liver function testing. 06/07/2021, ultrasound abdomen limited right upper quadrant showed mildly increased hepatic heterogenicity as can be seen in the setting of hepatic statuses. Additional testing was done for abnormal liver function. 06/27/2021, hepatitis panel negative. 07/25/2021, ferritin 809, iron saturation 58, TIBC 266 Sings alcohol cessation, transaminitis has improved.  07/25/2021, LFT showed AST of 76, ALT 117, bilirubin has decreased to 1.4. 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    Review of Systems  Constitutional:  Negative for chills, diaphoresis, fatigue, fever and unexpected weight change.  HENT:   Negative for hearing loss, lump/mass, nosebleeds and sore throat.   Eyes:  Negative for eye problems and icterus.  Respiratory:  Negative for chest tightness, cough, hemoptysis, shortness of breath and wheezing.   Cardiovascular:  Negative for chest pain and leg swelling.  Gastrointestinal:  Negative for abdominal distention, abdominal pain, blood in stool,  diarrhea, nausea and rectal pain.  Endocrine: Negative for hot flashes.  Genitourinary:  Negative for bladder incontinence, difficulty urinating, dysuria, frequency, hematuria and nocturia.   Musculoskeletal:  Negative for back pain, flank pain, gait problem and myalgias.  Skin:  Negative for rash.  Neurological:  Negative for dizziness, gait problem, headaches, numbness and seizures.  Hematological:  Negative for adenopathy. Does not bruise/bleed easily.  Psychiatric/Behavioral:  Negative for confusion and decreased concentration. The patient is not nervous/anxious.    MEDICAL HISTORY:  Past Medical History:  Diagnosis Date   Aortic insufficiency    Cardiomyopathy ([place])    Dyslipidemia    Hypertension    Polymyositis ([place])    Raynaud's disease    Vitamin D deficiency     SURGICAL HISTORY: Past Surgical History:  Procedure Laterality Date   COLONOSCOPY     COLONOSCOPY WITH PROPOFOL N/A 07/19/2016   Procedure: COLONOSCOPY WITH PROPOFOL;  Surgeon: Manya Silvas, MD;  Location: [place];  Service: Endoscopy;  Laterality: N/A;   ESOPHAGOGASTRODUODENOSCOPY (EGD) WITH PROPOFOL N/A 07/19/2016   Procedure: ESOPHAGOGASTRODUODENOSCOPY (EGD) WITH PROPOFOL;  Surgeon: Manya Silvas, MD;  Location: [facility] ENDOSCOPY;  Service: Endoscopy;  Laterality: N/A;   MUSCLE BIOPSY      SOCIAL HISTORY: Social History   Socioeconomic History   Marital status: Married    Spouse name: Not on file   Number of children: Not on file   Years of education: Not on file   Highest education level: Not on file  Occupational History   Not on file  Tobacco Use   Smoking status: Never   Smokeless tobacco: Never  Vaping Use   Vaping Use: Never used  Substance and Sexual Activity   Alcohol use: Yes  Comment: 1 glass of wine, stopped in August 2022   Drug use: No   Sexual activity: Not on file  Other Topics Concern   Not on file  Social History Narrative   Not on file   Social Determinants  of Health   Financial Resource Strain: Not on file  Food Insecurity: Not on file  Transportation Needs: Not on file  Physical Activity: Not on file  Stress: Not on file  Social Connections: Not on file  Intimate Partner Violence: Not on file    FAMILY HISTORY: Family History  Problem Relation Age of Onset   Hypertension Mother    Skin cancer Mother    Colon cancer Mother    Lung cancer Father    Aneurysm Father     ALLERGIES:  has No Known Allergies.  MEDICATIONS:  Current Outpatient Medications  Medication Sig Dispense Refill   Acetylcysteine (NAC PO) Take by mouth.     aspirin 81 MG chewable tablet Chew by mouth daily.     b complex vitamins tablet Take 1 tablet by mouth daily.     Bioflavonoid Products (BIOFLEX PO) Take by mouth.     Cinnamon Bark POWD by Does not apply route.     CRANBERRY EXTRACT PO Take by mouth.     NATURAL PSYLLIUM SEED PO Take by mouth.     niacin 500 MG tablet Take 500 mg by mouth at bedtime.     OMEGA-3 FATTY ACIDS PO Take by mouth.     TURMERIC PO Take by mouth.     vitamin C (ASCORBIC ACID) 500 MG tablet Take 500 mg by mouth daily.     No current facility-administered medications for this visit.     PHYSICAL EXAMINATION: ECOG PERFORMANCE STATUS: 0 - Asymptomatic Vitals:   08/28/21 1141  BP: 130/90  Pulse: 86  Resp: 18  Temp: 97.8 F (36.6 C)   Filed Weights   08/28/21 1141  Weight: 167 lb 12.8 oz (76.1 kg)    Physical Exam Constitutional:      General: He is not in acute distress. HENT:     Head: Normocephalic and atraumatic.  Eyes:     General: No scleral icterus. Cardiovascular:     Rate and Rhythm: Normal rate and regular rhythm.     Heart sounds: Normal heart sounds.  Pulmonary:     Effort: Pulmonary effort is normal. No respiratory distress.     Breath sounds: No wheezing.  Abdominal:     General: Bowel sounds are normal. There is no distension.     Palpations: Abdomen is soft.  Musculoskeletal:         General: No deformity. Normal range of motion.     Cervical back: Normal range of motion and neck supple.  Skin:    General: Skin is warm and dry.     Findings: No erythema or rash.  Neurological:     Mental Status: He is alert and oriented to person, place, and time. Mental status is at baseline.     Cranial Nerves: No cranial nerve deficit.     Coordination: Coordination normal.  Psychiatric:        Mood and Affect: Mood normal.    LABORATORY DATA:  I have reviewed the data as listed Lab Results  Component Value Date   WBC 5.9 08/28/2021   HGB 16.2 08/28/2021   HCT 47.0 08/28/2021   MCV 99.4 08/28/2021   PLT 178 08/28/2021   Recent Labs  08/28/21 1218  NA 138  K 4.4  CL 101  CO2 26  GLUCOSE 104*  BUN 16  CREATININE 0.95  CALCIUM 9.1  GFRNONAA >60  PROT 7.1  ALBUMIN 4.1  AST 149*  ALT 253*  ALKPHOS 94  BILITOT 1.6*   Iron/TIBC/Ferritin/ %Sat    Component Value Date/Time   IRON 159 08/28/2021 1218   TIBC 283 08/28/2021 1218   FERRITIN 966 (H) 08/28/2021 1218   IRONPCTSAT 56 (H) 08/28/2021 1218      RADIOGRAPHIC STUDIES: I have personally reviewed the radiological images as listed and agreed with the findings in the report. No results found.    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 Elevated ferritin   3. Fatty liver disease, nonalcoholic    #Hemochromatosis carrier, Discussed with the patient that the majority of heterozygous hemochromatosis carrier patients will not develop iron overload and do not need frequent phlebotomy treatments. I suspect that the elevated ferritin is a combination of previous alcohol use/liver injury secondary to alcohol and fatty liver disease/chronic inflammation. Iron panel may not reflect his iron store level.  Patient has history of polymyositis,raynaud's phenomenon,  Recommend to repeat CBC, CMP, iron TIBC ferritin.  Recommend patient to avoid alcohol.  First degree relatives should be tested for  hemochromatosis gene mutations. Recommend patient to avoid iron supplementation, vitamin C supplementation. He takes vitamin C supplementation and I recommend him to stop.   Labs are reviewed. Iron saturation is persistently high at 56, ferritin further trending high at 966. I recommend him to check MRI liver for evaluation of iron deposit.  Will ask [facility] to order it to be done at [facility].  Empiric phlebotomy may be considered if ferritin progressively increases.    Orders Placed This Encounter  Procedures   Iron and TIBC    Standing Status:   Future    Number of Occurrences:   1    Standing Expiration Date:   08/28/2022   Ferritin    Standing Status:   Future    Number of Occurrences:   1    Standing Expiration Date:   02/25/2022   CBC with Differential/Platelet    Standing Status:   Future    Number of Occurrences:   1    Standing Expiration Date:   08/28/2022   Comprehensive metabolic panel    Standing Status:   Future    Number of Occurrences:   1    Standing Expiration Date:   08/28/2022    All questions were answered. The patient knows to call the clinic with any problems questions or concerns.  cc Geanie Kenning, [place]*    Return of visit:  To be determined.  Thank you for this kind referral and the opportunity to participate in the care of this patient. A copy of today's note is routed to referring provider   Earlie Server, MD, PhD 08/28/2021

## 2021-08-29 ENCOUNTER — Telehealth: Payer: Self-pay

## 2021-08-29 NOTE — Telephone Encounter (Signed)
Can you cancel his phleb on 1/26 since he is scheduled to donate blood on 1/30. Thanks. I've sent mychart to pt to let him know. No need to call

## 2021-08-29 NOTE — Telephone Encounter (Signed)
-----   Message from Rickard Patience, MD sent at 08/28/2021  9:27 PM EST ----- Please let him know that his iron panel shows high ferritin level. I recommend him to get MRI done. I have ask his GI team to order it to be done at [facility].  Follow up appt   Labs in 8 weeks, -ordered, 2-3 days prior to MD + phlebotomy.

## 2021-08-29 NOTE — Telephone Encounter (Signed)
Called pt and no answer. Left VM with MD results and recommendation. Mychart message sent as well.

## 2021-08-30 ENCOUNTER — Other Ambulatory Visit: Payer: Self-pay | Admitting: Gastroenterology

## 2021-09-13 ENCOUNTER — Telehealth: Payer: Self-pay | Admitting: Oncology

## 2021-09-13 NOTE — Telephone Encounter (Signed)
Pt called and want  to know if her can change his appt on 12-14 to 12-15? Call back at [phone]

## 2021-10-15 ENCOUNTER — Telehealth: Payer: Self-pay | Admitting: Oncology

## 2021-10-15 NOTE — Telephone Encounter (Signed)
Pt called to reschedule his appt for 2-15 and 2-16. Call back at [phone]

## 2021-10-16 ENCOUNTER — Encounter: Payer: Self-pay | Admitting: Internal Medicine

## 2021-10-17 ENCOUNTER — Ambulatory Visit: Payer: Medicare Other | Admitting: Anesthesiology

## 2021-10-17 ENCOUNTER — Ambulatory Visit
Admission: RE | Admit: 2021-10-17 | Discharge: 2021-10-17 | Disposition: A | Discharge status: Home / Self Care | Payer: Medicare Other | Attending: Internal Medicine | Admitting: Internal Medicine

## 2021-10-17 ENCOUNTER — Encounter
Admission: RE | Disposition: A | Discharge status: Home / Self Care | Payer: Self-pay | Source: Home / Self Care | Attending: Internal Medicine

## 2021-10-17 DIAGNOSIS — K2970 Gastritis, unspecified, without bleeding: Secondary | ICD-10-CM | POA: Diagnosis not present

## 2021-10-17 DIAGNOSIS — K219 Gastro-esophageal reflux disease without esophagitis: Secondary | ICD-10-CM | POA: Insufficient documentation

## 2021-10-17 DIAGNOSIS — I739 Peripheral vascular disease, unspecified: Secondary | ICD-10-CM | POA: Insufficient documentation

## 2021-10-17 DIAGNOSIS — I10 Essential (primary) hypertension: Secondary | ICD-10-CM | POA: Diagnosis not present

## 2021-10-17 DIAGNOSIS — K2289 Other specified disease of esophagus: Secondary | ICD-10-CM | POA: Diagnosis not present

## 2021-10-17 DIAGNOSIS — K449 Diaphragmatic hernia without obstruction or gangrene: Secondary | ICD-10-CM | POA: Insufficient documentation

## 2021-10-17 DIAGNOSIS — K640 First degree hemorrhoids: Secondary | ICD-10-CM | POA: Insufficient documentation

## 2021-10-17 DIAGNOSIS — Z800 Family history of malignant neoplasm of digestive organs: Secondary | ICD-10-CM | POA: Diagnosis not present

## 2021-10-17 DIAGNOSIS — K5730 Diverticulosis of large intestine without perforation or abscess without bleeding: Secondary | ICD-10-CM | POA: Diagnosis not present

## 2021-10-17 DIAGNOSIS — M3320 Polymyositis, organ involvement unspecified: Secondary | ICD-10-CM | POA: Diagnosis not present

## 2021-10-17 DIAGNOSIS — I7300 Raynaud's syndrome without gangrene: Secondary | ICD-10-CM | POA: Diagnosis not present

## 2021-10-17 DIAGNOSIS — Z1211 Encounter for screening for malignant neoplasm of colon: Secondary | ICD-10-CM | POA: Diagnosis not present

## 2021-10-17 DIAGNOSIS — Z8371 Family history of colonic polyps: Secondary | ICD-10-CM | POA: Insufficient documentation

## 2021-10-17 HISTORY — PX: ESOPHAGOGASTRODUODENOSCOPY: SHX5428

## 2021-10-17 HISTORY — PX: COLONOSCOPY: SHX5424

## 2021-10-17 SURGERY — COLONOSCOPY
Anesthesia: General

## 2021-10-17 MED ORDER — PROPOFOL 500 MG/50ML IV EMUL
INTRAVENOUS | Status: DC | PRN
  Administered 2021-10-17: 125 ug/kg/min via INTRAVENOUS

## 2021-10-17 MED ORDER — LIDOCAINE HCL (PF) 2 % IJ SOLN
INTRAMUSCULAR | Status: AC
  Filled 2021-10-17: qty 5

## 2021-10-17 MED ORDER — PROPOFOL 10 MG/ML IV BOLUS
INTRAVENOUS | Status: DC | PRN
  Administered 2021-10-17: 60 mg via INTRAVENOUS
  Administered 2021-10-17: 40 mg via INTRAVENOUS

## 2021-10-17 MED ORDER — SODIUM CHLORIDE 0.9 % IV SOLN
INTRAVENOUS | Status: DC
  Administered 2021-10-17: 20 mL/h via INTRAVENOUS

## 2021-10-17 MED ORDER — PROPOFOL 500 MG/50ML IV EMUL
INTRAVENOUS | Status: AC
  Filled 2021-10-17: qty 50

## 2021-10-17 MED ORDER — LIDOCAINE HCL (CARDIAC) PF 100 MG/5ML IV SOSY
PREFILLED_SYRINGE | INTRAVENOUS | Status: DC | PRN
  Administered 2021-10-17: 50 mg via INTRAVENOUS

## 2021-10-17 NOTE — Transfer of Care (Signed)
Immediate Anesthesia Transfer of Care Note  Patient: Kent Cunningham  Procedure(s) Performed: COLONOSCOPY ESOPHAGOGASTRODUODENOSCOPY (EGD)  Patient Location: PACU  Anesthesia Type:General  Level of Consciousness: awake and drowsy  Airway & Oxygen Therapy: Patient Spontanous Breathing  Post-op Assessment: Report given to RN and Post -op Vital signs reviewed and stable  Post vital signs: Reviewed and stable  Last Vitals:  Vitals Value Taken Time  BP    Temp    Pulse    Resp    SpO2      Last Pain:  Vitals:   10/17/21 0952  TempSrc: Temporal  PainSc: 0-No pain         Complications: No notable events documented.

## 2021-10-17 NOTE — Op Note (Signed)
[facility] Gastroenterology Patient Name: Kent GongMichael Spink Procedure Date: 10/17/2021 10:16 AM MRN: [id number] Account #: [id number] Date of Birth: 23-Mar-1946 Admit Type: Outpatient Age: 76 [facility] Gender: Male Note Status: Finalized Instrument Name: Laurette SchimkeUpper-Endoscope [id number] Procedure:             Upper GI endoscopy Indications:           Gastro-esophageal reflux disease Providers:             Boykin Nearingeodoro K. Norma Fredricksonoledo MD, MD Referring MD:          Duane LopeJeffrey D. Judithann SheenSparks, MD (Referring MD) Medicines:             Propofol per Anesthesia Complications:         No immediate complications. Procedure:             Pre-Anesthesia Assessment:                        - The risks and benefits of the procedure and the                         sedation options and risks were discussed with the                         patient. All questions were answered and informed                         consent was obtained.                        - Patient identification and proposed procedure were                         verified prior to the procedure by the nurse. The                         procedure was verified in the procedure room.                        - ASA Grade Assessment: II - A patient with mild                         systemic disease.                        - After reviewing the risks and benefits, the patient                         was deemed in satisfactory condition to undergo the                         procedure.                        After obtaining informed consent, the endoscope was                         passed under direct vision. Throughout the procedure,  the patient's blood pressure, pulse, and oxygen                         saturations were monitored continuously. The Endoscope                         was introduced through the mouth, and advanced to the                         third part of duodenum. The upper GI endoscopy was                          accomplished without difficulty. The patient tolerated                         the procedure well. 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      Mucosal changes including circumferential folds and punctate white spots       were found in the entire esophagus. Esophageal findings were graded       using the Eosinophilic Esophagitis Endoscopic Reference Score       (EoE-EREFS) as: Edema Grade 0 Normal (distinct vascular markings), Rings       Grade 1 Mild (subtle circumferential ridges seen on esophageal       distension), Exudates Grade 0 None (no white lesions seen), Furrows       Grade 0 None (no vertical lines seen) and Stricture none (no stricture       found). Biopsies were obtained from the proximal and distal esophagus       with cold forceps for histology of suspected eosinophilic esophagitis.      Localized minimal inflammation characterized by erythema was found in       the gastric antrum.      A 1 cm hiatal hernia was present.      The examined duodenum was normal.      The exam was otherwise without abnormality. Impression:            - Glycogenic acanthosis of the esophagus.                        - Esophageal mucosal changes suspicious for                         eosinophilic esophagitis. Biopsied.                        - Gastritis.                        - 1 cm hiatal hernia.                        - Normal examined duodenum.                        - The examination was otherwise normal. Recommendation:        - Continue present medications.                        - Await pathology results.                        -  Proceed with colonoscopy Procedure Code(s):     --- Professional ---                        [phone], Esophagogastroduodenoscopy, flexible,                         transoral; with biopsy, single or multiple Diagnosis Code(s):     --- Professional ---                        K21.9, Gastro-esophageal reflux  disease without                         esophagitis                        K44.9, Diaphragmatic hernia without obstruction or                         gangrene                        K29.70, Gastritis, unspecified, without bleeding                        K22.8, Other specified diseases of esophagus CPT copyright 2019 American Medical Association. All rights reserved. The codes documented in this report are preliminary and upon coder review may  be revised to meet current compliance requirements. Stanton Kidney MD, MD 10/17/2021 10:42:54 AM This report has been signed electronically. Number of Addenda: 0 Note Initiated On: 10/17/2021 10:16 AM Estimated Blood Loss:  Estimated blood loss: none.      [facility]

## 2021-10-17 NOTE — Interval H&P Note (Signed)
History and Physical Interval Note:  10/17/2021 10:23 AM  Kent Cunningham  has presented today for surgery, with the diagnosis of Family history of colon cancer (Z80.0) Gastroesophageal reflux disease with hiatal hernia (K21.9,K44.9) Gastritis and duodenitis (K29.90).  The various methods of treatment have been discussed with the patient and family. After consideration of risks, benefits and other options for treatment, the patient has consented to  Procedure(s): COLONOSCOPY (N/A) ESOPHAGOGASTRODUODENOSCOPY (EGD) (N/A) as a surgical intervention.  The patient's history has been reviewed, patient examined, no change in status, stable for surgery.  I have reviewed the patient's chart and labs.  Questions were answered to the patient's satisfaction.     [place], [place]

## 2021-10-17 NOTE — H&P (Signed)
Outpatient short stay form Pre-procedure 10/17/2021 10:21 AM Tijana Walder K. Norma Fredrickson, M.D.  Primary Physician: Aram Beecham, M.D.  Reason for visit:  GERD, history of gastritis/duodenitis, Family history of colon polyps.  History of present illness:  76 y/o male presented to our office in Oct 2022 with exacerbation of GERD on Pantoprazole 20mg  daily. His dose was increased, and since then, he has had no breakthrough symptoms of GERD. Denies waterbrash, Dysphagia, nausea, vomiting, hemetemesis, melena or anorexia. No abdominal pain.  76 year old patient presenting for family history of colon cancer. Patient denies any change in bowel habits, rectal bleeding or involuntary weight loss.     Current Facility-Administered Medications:    0.9 %  sodium chloride infusion, , Intravenous, Continuous, [place], [place], MD, Last Rate: 20 mL/hr at 10/17/21 1007, 20 mL/hr at 10/17/21 1007  Medications Prior to Admission  Medication Sig Dispense Refill Last Dose   Acetylcysteine (NAC PO) Take by mouth.   Past Week   aspirin 81 MG chewable tablet Chew by mouth daily.   Past Week   b complex vitamins tablet Take 1 tablet by mouth daily.   Past Week   Bioflavonoid Products (BIOFLEX PO) Take by mouth.   Past Week   Cinnamon Bark POWD by Does not apply route.   Past Week   CRANBERRY EXTRACT PO Take by mouth.   Past Week   NATURAL PSYLLIUM SEED PO Take by mouth.   Past Week   niacin 500 MG tablet Take 500 mg by mouth at bedtime.   Past Week   OMEGA-3 FATTY ACIDS PO Take by mouth.   Past Week   TURMERIC PO Take by mouth.   Past Week   vitamin C (ASCORBIC ACID) 500 MG tablet Take 500 mg by mouth daily.   Past Week     No Known Allergies   Past Medical History:  Diagnosis Date   Aortic insufficiency    Cardiomyopathy ([facility])    Dyslipidemia    Hypertension    Polymyositis ([facility])    Raynaud's disease    Vitamin D deficiency     Review of systems:  Otherwise negative.    Physical Exam  Gen:  Alert, oriented. Appears stated age.  HEENT: [place]/AT. PERRLA. Lungs: CTA, no wheezes. CV: RR nl S1, S2. Abd: soft, benign, no masses. BS+ Ext: No edema. Pulses 2+    Planned procedures: Proceed with EGD and colonoscopy. The patient understands the nature of the planned procedure, indications, risks, alternatives and potential complications including but not limited to bleeding, infection, perforation, damage to internal organs and possible oversedation/side effects from anesthesia. The patient agrees and gives consent to proceed.  Please refer to procedure notes for findings, recommendations and patient disposition/instructions.     Jahzier Villalon K. 10/19/21, M.D. Gastroenterology 10/17/2021  10:21 AM

## 2021-10-17 NOTE — Interval H&P Note (Signed)
History and Physical Interval Note:  10/17/2021 10:15 AM  Kent Cunningham  has presented today for surgery, with the diagnosis of Family history of colon cancer (Z80.0) Gastroesophageal reflux disease with hiatal hernia (K21.9,K44.9) Gastritis and duodenitis (K29.90).  The various methods of treatment have been discussed with the patient and family. After consideration of risks, benefits and other options for treatment, the patient has consented to  Procedure(s): COLONOSCOPY (N/A) ESOPHAGOGASTRODUODENOSCOPY (EGD) (N/A) as a surgical intervention.  The patient's history has been reviewed, patient examined, no change in status, stable for surgery.  I have reviewed the patient's chart and labs.  Questions were answered to the patient's satisfaction.     [place], [place]

## 2021-10-17 NOTE — Op Note (Signed)
[facility] Patient Name: Kent Cunningham Procedure Date: 10/17/2021 10:16 AM MRN: [id number] Account #: [id number] Date of Birth: 1946/08/15 Admit Type: Outpatient Age: 76 Room: [facility] ENDO ROOM 2 Gender: Male Note Status: Finalized Instrument Name: Prentice Docker [id number] Procedure:             Colonoscopy Indications:           Colon cancer screening in patient at increased risk:                         Family history of 1st-degree relative with colon polyps Providers:             Boykin Nearing. Norma Fredrickson MD, MD Referring MD:          Duane Lope. Judithann Sheen, MD (Referring MD) Medicines:             Propofol per Anesthesia Complications:         No immediate complications. Procedure:             Pre-Anesthesia Assessment:                        - The risks and benefits of the procedure and the                         sedation options and risks were discussed with the                         patient. All questions were answered and informed                         consent was obtained.                        - Patient identification and proposed procedure were                         verified prior to the procedure by the nurse. The                         procedure was verified in the procedure room.                        - ASA Grade Assessment: III - A patient with severe                         systemic disease.                        - After reviewing the risks and benefits, the patient                         was deemed in satisfactory condition to undergo the                         procedure.                        After obtaining informed consent, the colonoscope was  passed under direct vision. Throughout the procedure,                         the patient's blood pressure, pulse, and oxygen                         saturations were monitored continuously. The                         Colonoscope was introduced through the anus and                          advanced to the the cecum, identified by appendiceal                         orifice and ileocecal valve. The colonoscopy was                         somewhat difficult due to significant looping.                         Successful completion of the procedure was aided by                         applying abdominal pressure. The patient tolerated the                         procedure well. The quality of the bowel preparation                         was good. The ileocecal valve, appendiceal orifice,                         and rectum were photographed. Findings:      The perianal and digital rectal examinations were normal. Pertinent       negatives include normal sphincter tone and no palpable rectal lesions.      Non-bleeding internal hemorrhoids were found during retroflexion. The       hemorrhoids were Grade I (internal hemorrhoids that do not prolapse).      Many medium-mouthed diverticula were found in the sigmoid colon.      The exam was otherwise without abnormality. Impression:            - Non-bleeding internal hemorrhoids.                        - Diverticulosis in the sigmoid colon.                        - The examination was otherwise normal.                        - No specimens collected. Recommendation:        - Await pathology results from EGD, also performed                         today.                        - Patient has a  contact number available for                         emergencies. The signs and symptoms of potential                         delayed complications were discussed with the patient.                         Return to normal activities tomorrow. Written                         discharge instructions were provided to the patient.                        - Resume previous diet.                        - Continue present medications.                        - No repeat colonoscopy due to current age 51(66 years                         or  older) and the absence of colonic polyps.                        - You do NOT require further colon cancer screening                         measures (Annual stool testing (i.e. hemoccult, FIT,                         cologuard), sigmoidoscopy, colonoscopy or CT                         colonography). You should share this recommendation                         with your Primary Care provider.                        - Return to GI office in 1 year.                        - The findings and recommendations were discussed with                         the patient. Procedure Code(s):     --- Professional ---                        [id number], Colorectal cancer screening; colonoscopy on                         individual at high risk Diagnosis Code(s):     --- Professional ---                        K57.30, Diverticulosis of large intestine without  perforation or abscess without bleeding                        K64.0, First degree hemorrhoids                        Z83.71, Family history of colonic polyps CPT copyright 2019 [employer]. All rights reserved. The codes documented in this report are preliminary and upon coder review may  be revised to meet current compliance requirements. Stanton Kidneyeodoro K Lawrie Tunks MD, MD 10/17/2021 11:01:02 AM This report has been signed electronically. Number of Addenda: 0 Note Initiated On: 10/17/2021 10:16 AM Scope Withdrawal Time: 0 hours 5 minutes 58 seconds  Total Procedure Duration: 0 hours 11 minutes 23 seconds  Estimated Blood Loss:  Estimated blood loss: none.      [facility]

## 2021-10-17 NOTE — Anesthesia Preprocedure Evaluation (Addendum)
Anesthesia Evaluation  Patient identified by MRN, date of birth, ID band Patient awake    Reviewed: Allergy & Precautions, NPO status , Patient's Chart, lab work & pertinent test results  History of Anesthesia Complications Negative for: history of anesthetic complications  Airway Mallampati: I   Neck ROM: Full    Dental no notable dental hx.    Pulmonary neg pulmonary ROS,    Pulmonary exam normal breath sounds clear to auscultation       Cardiovascular hypertension, + Peripheral Vascular Disease  Normal cardiovascular exam+ Valvular Problems/Murmurs AI  Rhythm:Regular Rate:Normal  ECG 06/13/21: normal  Echo 11/17/20:  NORMAL LEFT VENTRICULAR SYSTOLIC FUNCTION  NORMAL RIGHT VENTRICULAR SYSTOLIC FUNCTION  MILD VALVULAR REGURGITATION   NO VALVULAR STENOSIS    Neuro/Psych Vertigo     GI/Hepatic GERD  ,  Endo/Other  negative endocrine ROS  Renal/GU negative Renal ROS     Musculoskeletal Raynaud's, polymyositis   Abdominal   Peds  Hematology negative hematology ROS (+)   Anesthesia Other Findings Reviewed 07/23/21 cardiology note.  Reproductive/Obstetrics                            Anesthesia Physical Anesthesia Plan  ASA: 2  Anesthesia Plan: General   Post-op Pain Management:    Induction: Intravenous  PONV Risk Score and Plan: 2 and Propofol infusion, TIVA and Treatment may vary due to age or medical condition  Airway Management Planned: Natural Airway  Additional Equipment:   Intra-op Plan:   Post-operative Plan:   Informed Consent: I have reviewed the patients History and Physical, chart, labs and discussed the procedure including the risks, benefits and alternatives for the proposed anesthesia with the patient or authorized representative who has indicated his/her understanding and acceptance.       Plan Discussed with: CRNA  Anesthesia Plan Comments: (LMA/GETA  backup discussed.  Patient consented for risks of anesthesia including but not limited to:  - adverse reactions to medications - damage to eyes, teeth, lips or other oral mucosa - nerve damage due to positioning  - sore throat or hoarseness - damage to heart, brain, nerves, lungs, other parts of body or loss of life  Informed patient about role of CRNA in peri- and intra-operative care.  Patient voiced understanding.)        Anesthesia Quick Evaluation

## 2021-10-17 NOTE — H&P (Signed)
°  Outpatient short stay form Pre-procedure 10/17/2021 10:14 AM Kent Cunningham, M.D.  Primary Physician: Aram Beecham, M.D.  Reason for visit:  Family history of colon polyps  History of present illness:   76 year old patient presenting for family history of colon cancer. Patient denies any change in bowel habits, rectal bleeding or involuntary weight loss.     Current Facility-Administered Medications:    0.9 %  sodium chloride infusion, , Intravenous, Continuous, [place], Boykin Nearing, MD, Last Rate: 20 mL/hr at 10/17/21 1007, 20 mL/hr at 10/17/21 1007  Medications Prior to Admission  Medication Sig Dispense Refill Last Dose   Acetylcysteine (NAC PO) Take by mouth.   Past Week   aspirin 81 MG chewable tablet Chew by mouth daily.   Past Week   b complex vitamins tablet Take 1 tablet by mouth daily.   Past Week   Bioflavonoid Products (BIOFLEX PO) Take by mouth.   Past Week   Cinnamon Bark POWD by Does not apply route.   Past Week   CRANBERRY EXTRACT PO Take by mouth.   Past Week   NATURAL PSYLLIUM SEED PO Take by mouth.   Past Week   niacin 500 MG tablet Take 500 mg by mouth at bedtime.   Past Week   OMEGA-3 FATTY ACIDS PO Take by mouth.   Past Week   TURMERIC PO Take by mouth.   Past Week   vitamin C (ASCORBIC ACID) 500 MG tablet Take 500 mg by mouth daily.   Past Week     No Known Allergies   Past Medical History:  Diagnosis Date   Aortic insufficiency    Cardiomyopathy (HCC)    Dyslipidemia    Hypertension    Polymyositis (HCC)    Raynaud's disease    Vitamin D deficiency     Review of systems:  Otherwise negative.    Physical Exam  Gen: Alert, oriented. Appears stated age.  HEENT: [place]/AT. PERRLA. Lungs: CTA, no wheezes. CV: RR nl S1, S2. Abd: soft, benign, no masses. BS+ Ext: No edema. Pulses 2+    Planned procedures: Proceed with colonoscopy. The patient understands the nature of the planned procedure, indications, risks, alternatives and potential  complications including but not limited to bleeding, infection, perforation, damage to internal organs and possible oversedation/side effects from anesthesia. The patient agrees and gives consent to proceed.  Please refer to procedure notes for findings, recommendations and patient disposition/instructions.     Aslin Farinas K. Norma Cunningham, M.D. Gastroenterology 10/17/2021  10:14 AM

## 2021-10-17 NOTE — Anesthesia Postprocedure Evaluation (Signed)
Anesthesia Post Note  Patient: Kent Cunningham  Procedure(s) Performed: COLONOSCOPY ESOPHAGOGASTRODUODENOSCOPY (EGD)  Patient location during evaluation: PACU Anesthesia Type: General Level of consciousness: awake and alert, oriented and patient cooperative Pain management: pain level controlled Vital Signs Assessment: post-procedure vital signs reviewed and stable Respiratory status: spontaneous breathing, nonlabored ventilation and respiratory function stable Cardiovascular status: blood pressure returned to baseline and stable Postop Assessment: adequate PO intake Anesthetic complications: no   No notable events documented.   Last Vitals:  Vitals:   10/17/21 1120 10/17/21 1133  BP:  140/81  Pulse: 65 60  Resp: 19 20  Temp:    SpO2: 100% 100%    Last Pain:  Vitals:   10/17/21 1102  TempSrc: Temporal  PainSc:                  Darrin Nipper

## 2021-10-18 ENCOUNTER — Encounter: Payer: Self-pay | Admitting: Internal Medicine

## 2021-10-18 LAB — SURGICAL PATHOLOGY

## 2021-10-23 ENCOUNTER — Other Ambulatory Visit: Payer: Federal, State, Local not specified - PPO

## 2021-10-25 ENCOUNTER — Ambulatory Visit: Payer: Federal, State, Local not specified - PPO | Admitting: Oncology

## 2021-11-07 ENCOUNTER — Ambulatory Visit: Payer: Federal, State, Local not specified - PPO | Admitting: Oncology

## 2021-11-07 ENCOUNTER — Other Ambulatory Visit: Payer: Self-pay | Admitting: *Deleted

## 2021-11-07 ENCOUNTER — Other Ambulatory Visit: Payer: Federal, State, Local not specified - PPO

## 2021-11-07 DIAGNOSIS — Z148 Genetic carrier of other disease: Secondary | ICD-10-CM

## 2021-11-07 DIAGNOSIS — R7989 Other specified abnormal findings of blood chemistry: Secondary | ICD-10-CM

## 2021-11-13 ENCOUNTER — Other Ambulatory Visit: Payer: Self-pay

## 2021-11-13 ENCOUNTER — Inpatient Hospital Stay: Payer: Medicare Other | Attending: Oncology

## 2021-11-13 ENCOUNTER — Other Ambulatory Visit: Payer: Federal, State, Local not specified - PPO

## 2021-11-13 DIAGNOSIS — Z148 Genetic carrier of other disease: Secondary | ICD-10-CM | POA: Diagnosis not present

## 2021-11-13 DIAGNOSIS — R7989 Other specified abnormal findings of blood chemistry: Secondary | ICD-10-CM | POA: Insufficient documentation

## 2021-11-13 DIAGNOSIS — K760 Fatty (change of) liver, not elsewhere classified: Secondary | ICD-10-CM

## 2021-11-13 LAB — CBC WITH DIFFERENTIAL/PLATELET
Abs Immature Granulocytes: 0.02 10*3/uL (ref 0.00–0.07)
Basophils Absolute: 0 10*3/uL (ref 0.0–0.1)
Basophils Relative: 1 %
Eosinophils Absolute: 0.1 10*3/uL (ref 0.0–0.5)
Eosinophils Relative: 2 %
HCT: 44.4 % (ref 39.0–52.0)
Hemoglobin: 15.2 g/dL (ref 13.0–17.0)
Immature Granulocytes: 0 %
Lymphocytes Relative: 19 %
Lymphs Abs: 1.2 10*3/uL (ref 0.7–4.0)
MCH: 34 pg (ref 26.0–34.0)
MCHC: 34.2 g/dL (ref 30.0–36.0)
MCV: 99.3 fL (ref 80.0–100.0)
Monocytes Absolute: 0.7 10*3/uL (ref 0.1–1.0)
Monocytes Relative: 12 %
Neutro Abs: 4.1 10*3/uL (ref 1.7–7.7)
Neutrophils Relative %: 66 %
Platelets: 174 10*3/uL (ref 150–400)
RBC: 4.47 MIL/uL (ref 4.22–5.81)
RDW: 12.6 % (ref 11.5–15.5)
WBC: 6.1 10*3/uL (ref 4.0–10.5)
nRBC: 0 % (ref 0.0–0.2)

## 2021-11-13 LAB — IRON AND TIBC
Iron: 76 ug/dL (ref 45–182)
Saturation Ratios: 25 % (ref 17.9–39.5)
TIBC: 300 ug/dL (ref 250–450)
UIBC: 224 ug/dL

## 2021-11-13 LAB — COMPREHENSIVE METABOLIC PANEL
ALT: 31 U/L (ref 0–44)
AST: 33 U/L (ref 15–41)
Albumin: 4.2 g/dL (ref 3.5–5.0)
Alkaline Phosphatase: 71 U/L (ref 38–126)
Anion gap: 5 (ref 5–15)
BUN: 16 mg/dL (ref 8–23)
CO2: 30 mmol/L (ref 22–32)
Calcium: 8.9 mg/dL (ref 8.9–10.3)
Chloride: 99 mmol/L (ref 98–111)
Creatinine, Ser: 0.82 mg/dL (ref 0.61–1.24)
GFR, Estimated: >60 mL/min (ref >60)
Glucose, Bld: 100 mg/dL — ABNORMAL HIGH (ref 70–99)
Potassium: 4 mmol/L (ref 3.5–5.1)
Sodium: 134 mmol/L — ABNORMAL LOW (ref 135–145)
Total Bilirubin: 1 mg/dL (ref 0.3–1.2)
Total Protein: 7 g/dL (ref 6.5–8.1)

## 2021-11-13 LAB — FERRITIN: Ferritin: 176 ng/mL (ref 24–336)

## 2021-11-14 ENCOUNTER — Other Ambulatory Visit: Payer: Federal, State, Local not specified - PPO

## 2021-11-15 ENCOUNTER — Ambulatory Visit: Payer: Federal, State, Local not specified - PPO | Admitting: Oncology

## 2021-11-22 ENCOUNTER — Inpatient Hospital Stay: Payer: Medicare Other | Admitting: Nurse Practitioner

## 2021-11-22 ENCOUNTER — Ambulatory Visit: Payer: Federal, State, Local not specified - PPO | Admitting: Oncology

## 2023-04-03 IMAGING — US US ABDOMEN LIMITED
1 series · 14 of 25 positions shown · non-contrast
Comparison: May 21, 2016

CLINICAL DATA: Abnormal LFTs

EXAM:
ULTRASOUND ABDOMEN LIMITED RIGHT UPPER QUADRANT

[Series 1: us abdomen limited · 0.14mm/px · 14 of 25 slices shown]
[im 1/25]
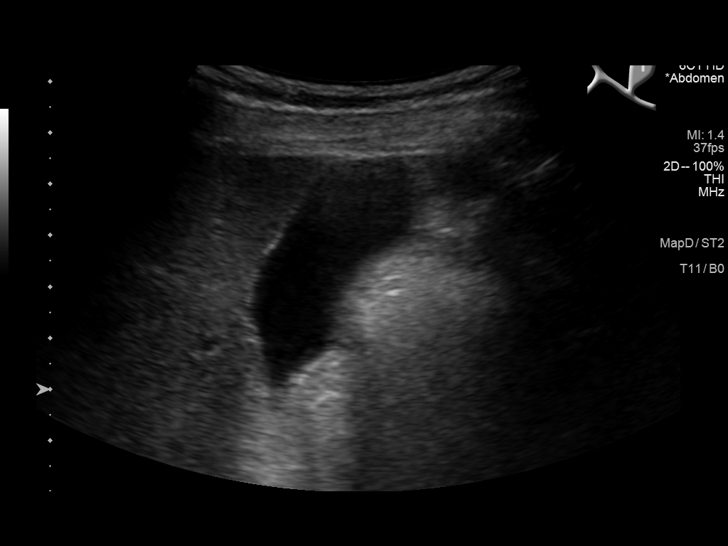
[im 3/25]
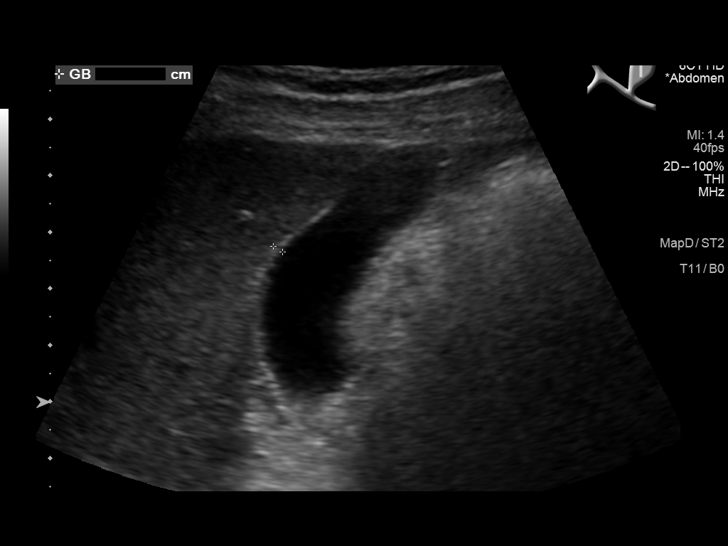
[im 5/25]
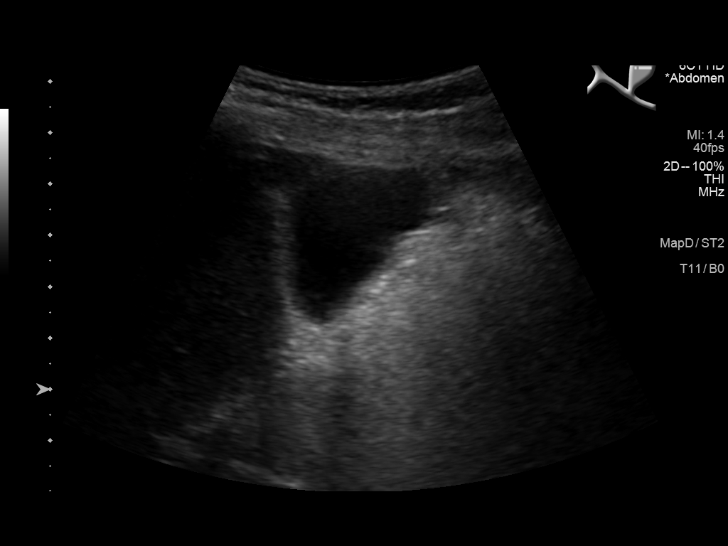
[im 7/25]
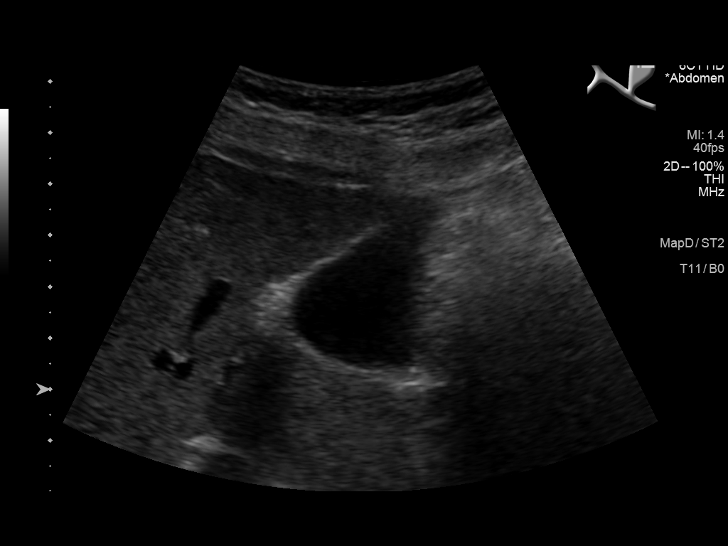
[im 9/25]
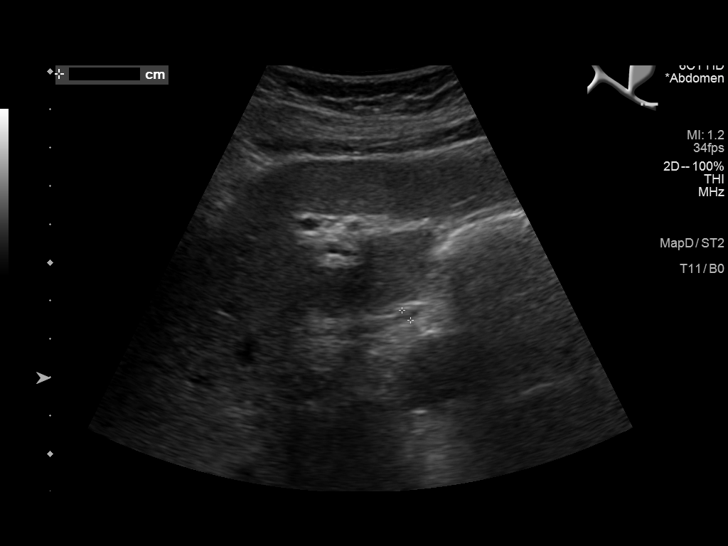
[im 10/25]
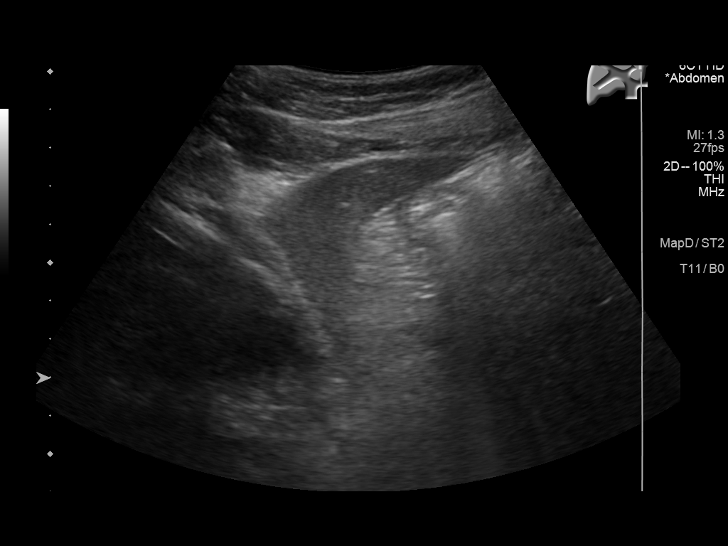
[im 12/25]
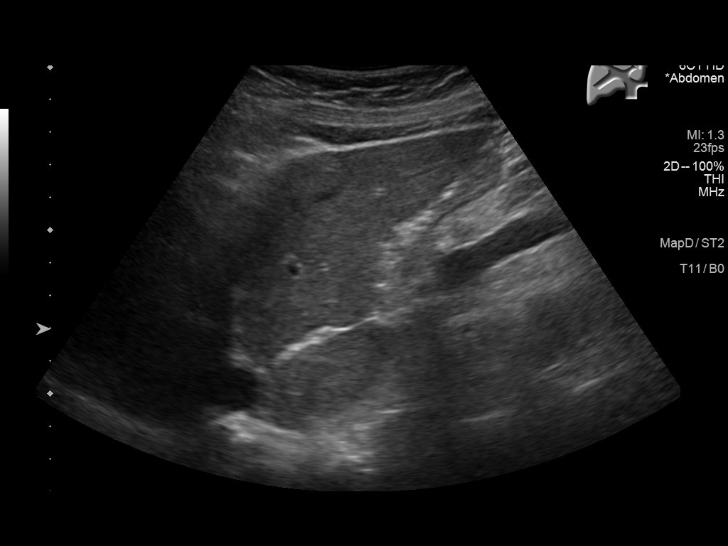
[im 14/25]
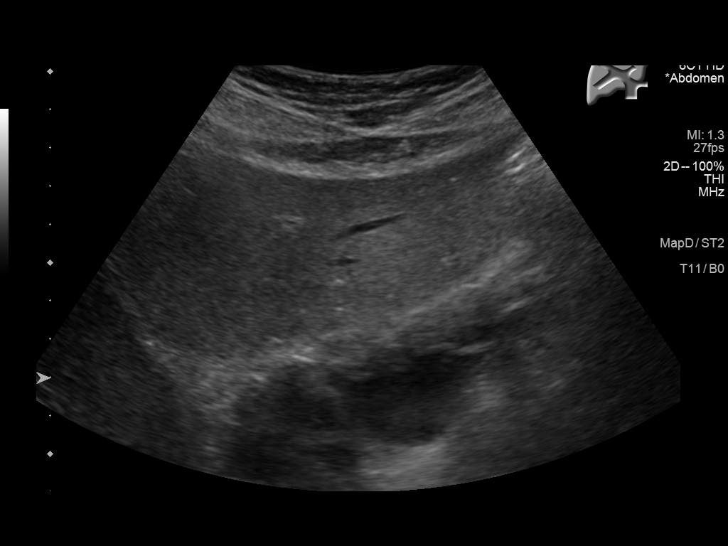
[im 16/25]
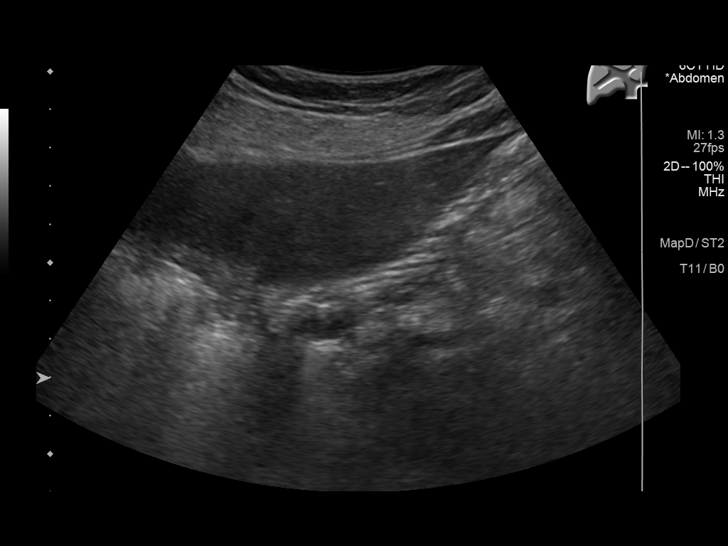
[im 17/25]
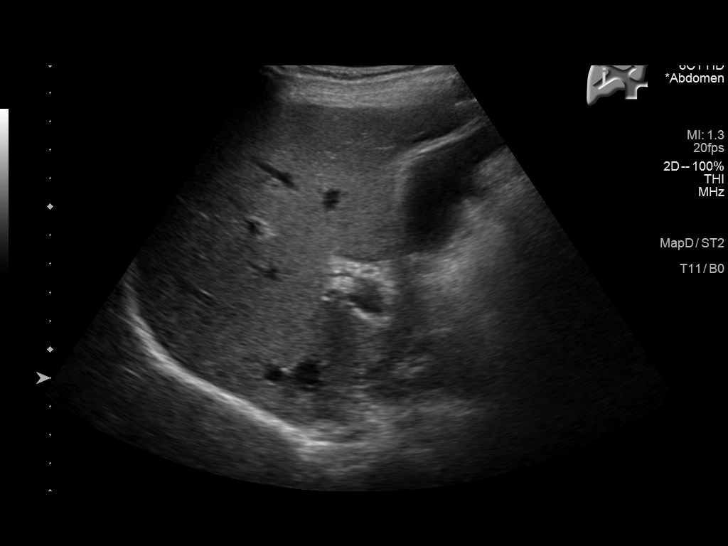
[im 19/25]
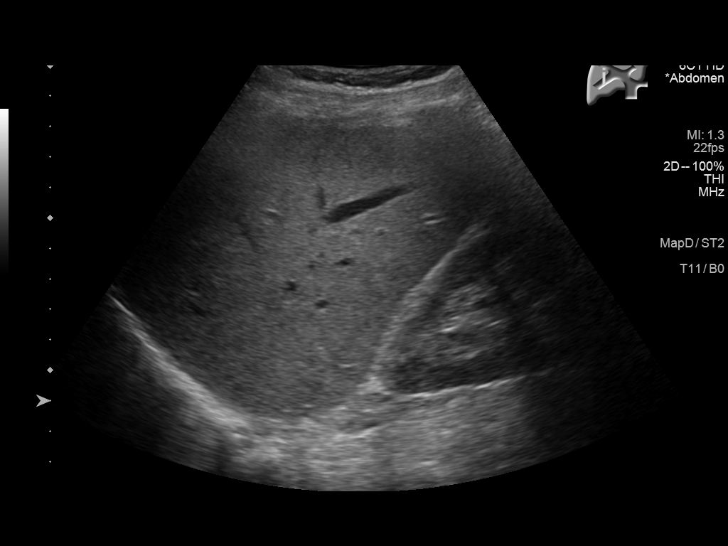
[im 21/25]
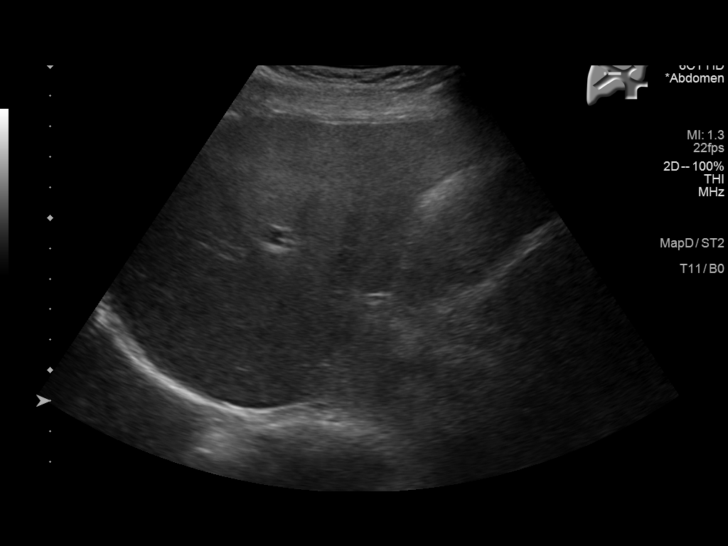
[im 23/25]
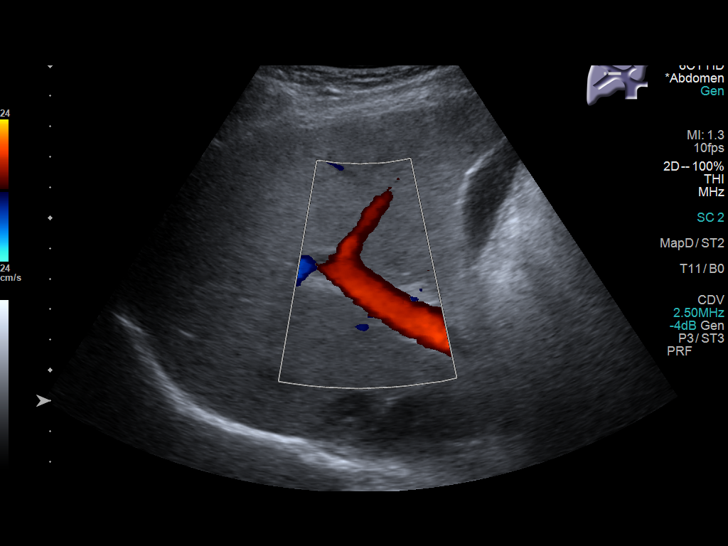
[im 25/25]
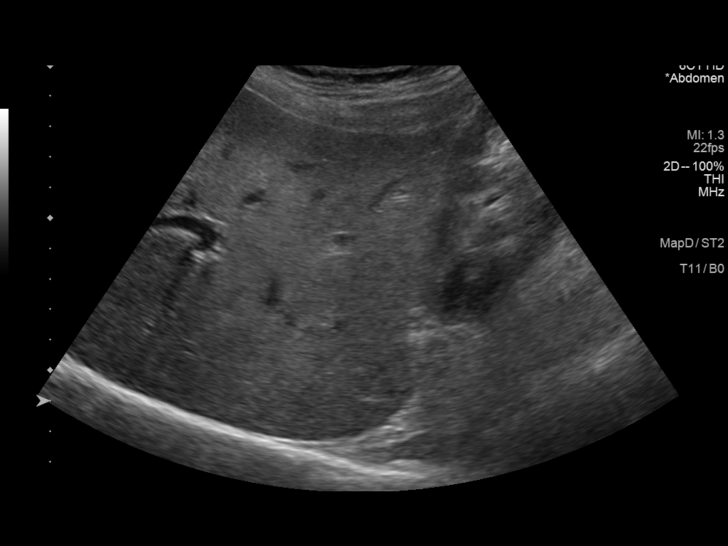

[14 of 25 positions shown; findings below may reference images not displayed]

FINDINGS: Gallbladder:

No gallstones or wall thickening visualized. No sonographic Murphy
sign noted by sonographer.

Common bile duct:

Diameter: 3 mm, normal

Liver:

No focal lesion identified. Mildly increased parenchymal
echogenicity. Portal vein is patent on color Doppler imaging with
normal direction of blood flow towards the liver.

Other: None.
IMPRESSION: Mildly increased hepatic echogenicity as can be seen in the setting
of hepatic steatosis.

## 2024-02-04 ENCOUNTER — Other Ambulatory Visit

## 2024-02-04 ENCOUNTER — Ambulatory Visit: Admitting: Oncology
